# Patient Record
Sex: Female | Born: 1970 | Race: White | Hispanic: No | Marital: Married | Smoking: Never smoker
Health system: Southern US, Community
[De-identification: ages and names within clinical notes are randomized; demographics above are authoritative.]

## PROBLEM LIST (undated history)

## (undated) DIAGNOSIS — E559 Vitamin D deficiency, unspecified: Secondary | ICD-10-CM

## (undated) DIAGNOSIS — E7800 Pure hypercholesterolemia, unspecified: Secondary | ICD-10-CM

## (undated) DIAGNOSIS — E538 Deficiency of other specified B group vitamins: Secondary | ICD-10-CM

## (undated) DIAGNOSIS — R011 Cardiac murmur, unspecified: Secondary | ICD-10-CM

## (undated) DIAGNOSIS — Z8619 Personal history of other infectious and parasitic diseases: Secondary | ICD-10-CM

## (undated) HISTORY — DX: Personal history of other infectious and parasitic diseases: Z86.19

## (undated) HISTORY — DX: Deficiency of other specified B group vitamins: E53.8

## (undated) HISTORY — PX: TONSILLECTOMY: SUR1361

## (undated) HISTORY — DX: Cardiac murmur, unspecified: R01.1

## (undated) HISTORY — DX: Vitamin D deficiency, unspecified: E55.9

---

## 2005-04-18 ENCOUNTER — Ambulatory Visit: Payer: Self-pay | Admitting: Obstetrics & Gynecology

## 2006-12-16 ENCOUNTER — Emergency Department: Payer: Self-pay | Admitting: Otolaryngology

## 2009-08-01 ENCOUNTER — Ambulatory Visit: Payer: Self-pay | Admitting: Obstetrics and Gynecology

## 2010-08-15 ENCOUNTER — Ambulatory Visit: Payer: Self-pay | Admitting: Obstetrics and Gynecology

## 2011-09-11 ENCOUNTER — Ambulatory Visit: Payer: Self-pay | Admitting: Obstetrics and Gynecology

## 2012-09-16 ENCOUNTER — Ambulatory Visit: Payer: Self-pay | Admitting: Obstetrics and Gynecology

## 2016-01-16 ENCOUNTER — Encounter: Payer: Self-pay | Admitting: Family Medicine

## 2016-12-10 ENCOUNTER — Other Ambulatory Visit: Payer: Self-pay | Admitting: Obstetrics & Gynecology

## 2016-12-10 DIAGNOSIS — Z1231 Encounter for screening mammogram for malignant neoplasm of breast: Secondary | ICD-10-CM

## 2017-01-10 ENCOUNTER — Ambulatory Visit
Admission: RE | Admit: 2017-01-10 | Discharge: 2017-01-10 | Disposition: A | Discharge status: Home / Self Care | Payer: BC Managed Care – PPO | Source: Ambulatory Visit | Attending: Obstetrics & Gynecology | Admitting: Obstetrics & Gynecology

## 2017-01-10 ENCOUNTER — Encounter (HOSPITAL_COMMUNITY): Payer: Self-pay

## 2017-01-10 DIAGNOSIS — Z1231 Encounter for screening mammogram for malignant neoplasm of breast: Secondary | ICD-10-CM | POA: Diagnosis present

## 2017-12-10 ENCOUNTER — Other Ambulatory Visit: Payer: Self-pay | Admitting: Obstetrics & Gynecology

## 2017-12-10 DIAGNOSIS — Z1231 Encounter for screening mammogram for malignant neoplasm of breast: Secondary | ICD-10-CM

## 2018-01-20 ENCOUNTER — Ambulatory Visit
Admission: RE | Admit: 2018-01-20 | Discharge: 2018-01-20 | Disposition: A | Discharge status: Home / Self Care | Payer: BC Managed Care – PPO | Source: Ambulatory Visit | Attending: Obstetrics & Gynecology | Admitting: Obstetrics & Gynecology

## 2018-01-20 DIAGNOSIS — Z1231 Encounter for screening mammogram for malignant neoplasm of breast: Secondary | ICD-10-CM

## 2018-12-24 ENCOUNTER — Other Ambulatory Visit: Payer: Self-pay | Admitting: Obstetrics & Gynecology

## 2018-12-25 ENCOUNTER — Other Ambulatory Visit: Payer: Self-pay | Admitting: Obstetrics & Gynecology

## 2018-12-25 DIAGNOSIS — Z1231 Encounter for screening mammogram for malignant neoplasm of breast: Secondary | ICD-10-CM

## 2019-01-21 ENCOUNTER — Ambulatory Visit
Admission: RE | Admit: 2019-01-21 | Discharge: 2019-01-21 | Disposition: A | Discharge status: Home / Self Care | Payer: BC Managed Care – PPO | Source: Ambulatory Visit | Attending: Obstetrics & Gynecology | Admitting: Obstetrics & Gynecology

## 2019-01-21 DIAGNOSIS — Z1231 Encounter for screening mammogram for malignant neoplasm of breast: Secondary | ICD-10-CM | POA: Diagnosis present

## 2019-02-18 ENCOUNTER — Ambulatory Visit: Payer: BC Managed Care – PPO | Admitting: Internal Medicine

## 2019-02-18 VITALS — BP 110/68 | HR 89 | Temp 98.3°F | Ht 64.0 in | Wt 115.0 lb

## 2019-02-18 DIAGNOSIS — E538 Deficiency of other specified B group vitamins: Secondary | ICD-10-CM | POA: Diagnosis not present

## 2019-02-18 DIAGNOSIS — Z1389 Encounter for screening for other disorder: Secondary | ICD-10-CM | POA: Diagnosis not present

## 2019-02-18 DIAGNOSIS — I341 Nonrheumatic mitral (valve) prolapse: Secondary | ICD-10-CM

## 2019-02-18 DIAGNOSIS — Z0184 Encounter for antibody response examination: Secondary | ICD-10-CM

## 2019-02-18 DIAGNOSIS — Z780 Asymptomatic menopausal state: Secondary | ICD-10-CM

## 2019-02-18 DIAGNOSIS — S90415A Abrasion, left lesser toe(s), initial encounter: Secondary | ICD-10-CM | POA: Diagnosis not present

## 2019-02-18 DIAGNOSIS — Z1211 Encounter for screening for malignant neoplasm of colon: Secondary | ICD-10-CM | POA: Diagnosis not present

## 2019-02-18 DIAGNOSIS — E559 Vitamin D deficiency, unspecified: Secondary | ICD-10-CM

## 2019-02-18 DIAGNOSIS — Z1159 Encounter for screening for other viral diseases: Secondary | ICD-10-CM

## 2019-02-18 MED ORDER — CYANOCOBALAMIN 1000 MCG/ML IJ SOLN: 1000.0000 ug | INTRAMUSCULAR | 11 refills | Status: DC

## 2019-02-18 MED ORDER — MUPIROCIN 2 % EX OINT: 1.0000 "application " | TOPICAL_OINTMENT | Freq: Two times a day (BID) | CUTANEOUS | 0 refills | Status: DC

## 2019-02-18 NOTE — Progress Notes (Signed)
Pre visit review using our clinic review tool, if applicable. No additional management support is needed unless otherwise documented below in the visit note. 

## 2019-02-18 NOTE — Progress Notes (Signed)
Chief Complaint  Patient presents with  . Establish Care   New patient  1. B12 def 12/25/2018 189 on B12 injections needs refill  2. Vit D def 12/25/2018 25.3 on D3 50K IU weekly  3. Left great toe had pedicure and toenail is sore nothing tried other than removing nail polish to see what is going on and neosporin otc  4. Labs reviewed 12/25/2018 CMET, CBC normal, low vitamin B12 and vitamin D, TSH 2.214, TC 204,  TG 83, LDL 124, HDL 63.7  5. C/w menopause last cycle 05/2018 hot flashes at times last fsh 26 in 2018  6. H/o MVP dx'ed age 59 y.o no echo recently no symptoms I.e chest pain, dizziness, sob    Review of Systems  Constitutional: Negative for weight loss.  HENT: Negative for hearing loss.   Eyes: Negative for blurred vision.  Respiratory: Negative for shortness of breath.   Cardiovascular: Negative for chest pain.  Gastrointestinal: Negative for abdominal pain.  Genitourinary:       No menses since 05/2018    Musculoskeletal: Negative for falls.  Skin: Negative for rash.  Neurological: Negative for headaches.  Psychiatric/Behavioral: Negative for depression.   Past Medical History:  Diagnosis Date  . B12 deficiency   . Heart murmur    MVP dx'ed age 37 no echo since   . History of chicken pox   . Vitamin D deficiency    Past Surgical History:  Procedure Laterality Date  . CESAREAN SECTION    . TONSILLECTOMY     Family History  Problem Relation Age of Onset  . Arthritis Mother   . Diabetes Father        type 2  . Breast cancer Neg Hx    Social History   Socioeconomic History  . Marital status: Married    Spouse name: Not on file  . Number of children: Not on file  . Years of education: Not on file  . Highest education level: Not on file  Occupational History  . Not on file  Social Needs  . Financial resource strain: Not on file  . Food insecurity:    Worry: Not on file    Inability: Not on file  . Transportation needs:    Medical: Not on file   Non-medical: Not on file  Tobacco Use  . Smoking status: Never Smoker  . Smokeless tobacco: Never Used  Substance and Sexual Activity  . Alcohol use: Yes    Comment: men  . Drug use: Not Currently  . Sexual activity: Yes    Partners: Male  Lifestyle  . Physical activity:    Days per week: Not on file    Minutes per session: Not on file  . Stress: Not on file  Relationships  . Social connections:    Talks on phone: Not on file    Gets together: Not on file    Attends religious service: Not on file    Active member of club or organization: Not on file    Attends meetings of clubs or organizations: Not on file    Relationship status: Not on file  . Intimate partner violence:    Fear of current or ex partner: Not on file    Emotionally abused: Not on file    Physically abused: Not on file    Forced sexual activity: Not on file  Other Topics Concern  . 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 cyanocobalamin (,VITAMIN B-12,) 1000 MCG/ML injection Inject 1 mL (1,000 mcg total) into the muscle every 30 (thirty) days.  . Vitamin D, Ergocalciferol, (DRISDOL) 1.25 MG (50000 UT) CAPS capsule Take 50,000 Units by mouth every 7 (seven) days.   . [DISCONTINUED] cyanocobalamin (,VITAMIN B-12,) 1000 MCG/ML injection Inject into the muscle.   No Known Allergies Recent Results (from the past 2160 hour(s))  Urinalysis, Routine w reflex microscopic     Status: Abnormal   Collection Time: 02/18/19  4:04 PM  Result Value Ref Range   Color, Urine YELLOW YELLOW   APPearance CLEAR CLEAR   Specific Gravity, Urine 1.008 1.001 - 1.03   pH 8.0 5.0 - 8.0   Glucose, UA NEGATIVE NEGATIVE   Bilirubin Urine NEGATIVE NEGATIVE   Ketones, ur NEGATIVE NEGATIVE   Hgb urine dipstick NEGATIVE NEGATIVE   Protein, ur NEGATIVE NEGATIVE    Nitrite NEGATIVE NEGATIVE   Leukocytes,Ua 3+ (A) NEGATIVE   WBC, UA 40-60 (A) 0 - 5 /HPF   RBC / HPF NONE SEEN 0 - 2 /HPF   Squamous Epithelial / LPF 0-5 < OR = 5 /HPF   Bacteria, UA NONE SEEN NONE SEEN /HPF   Hyaline Cast NONE SEEN NONE SEEN /LPF  FSH     Status: Abnormal   Collection Time: 02/18/19  4:04 PM  Result Value Ref Range   FSH 125.0 (H) mIU/mL    Comment:                     Reference Range .              Follicular Phase       7.0-62.3              Mid-cycle Peak         3.1-17.7              Luteal Phase           1.5- 9.1              Postmenopausal       23.0-116.3              .   Hepatitis B surface antibody,quantitative     Status: Abnormal   Collection Time: 02/18/19  4:04 PM  Result Value Ref Range   Hepatitis B-Post <5 (L) > OR = 10 mIU/mL    Comment: . Patient does not have immunity to hepatitis B virus. . For additional information, please refer to http://education.quest[facility].com/faq/FAQ105 (This link is being provided for informational/ educational purposes only).   Hepatitis A Ab, Total     Status: Abnormal   Collection Time: 02/18/19  4:04 PM  Result Value Ref Range   Hepatitis A AB,Total REACTIVE (A) NON-REACTI    Comment: . For additional information, please refer to  http://education.[facility].com/faq/FAQ202  (This link is being provided for informational/ educational purposes only.) .   Measles/Mumps/Rubella Immunity     Status: Abnormal   Collection Time: 02/18/19  4:04 PM  Result Value Ref Range   Rubeola IgG 27.80 AU/mL    Comment: AU/mL            Interpretation -----            -------------- <13.50  Negative 13.50-16.49      Equivocal >16.49           Positive . A positive result indicates that the patient has antibody to measles virus. It does not differentiate  between an active or past infection. The clinical  diagnosis must be interpreted in conjunction with  clinical signs and symptoms of the  patient.    Mumps IgG <9.00 (L) AU/mL    Comment:  AU/mL           Interpretation -------         ---------------- <9.00             Negative 9.00-10.99        Equivocal >10.99            Positive A positive result indicates that the patient has  antibody to mumps virus. It does not differentiate between an  active or past infection. The clinical diagnosis must be interpreted in conjunction with clinical signs and symptoms of the patient. .    Rubella 8.99 index    Comment:     Index            Interpretation     -----            --------------       <0.90            Not consistent with Immunity     0.90-0.99        Equivocal     > or = 1.00      Consistent with Immunity  . The presence of rubella IgG antibody suggests  immunization or past or current infection with rubella virus.    Objective  Body mass index is 19.74 kg/m. Wt Readings from Last 3 Encounters:  02/18/19 115 lb (52.2 kg)   Temp Readings from Last 3 Encounters:  02/18/19 98.3 F (36.8 C) (Oral)   BP Readings from Last 3 Encounters:  02/18/19 110/68   Pulse Readings from Last 3 Encounters:  02/18/19 89    Physical Exam Vitals signs and nursing note reviewed.  Constitutional:      Appearance: Normal appearance. She is well-developed and well-groomed.  HENT:     Head: Normocephalic and atraumatic.     Nose: Nose normal.     Mouth/Throat:     Mouth: Mucous membranes are moist.     Pharynx: Oropharynx is clear.  Eyes:     Conjunctiva/sclera: Conjunctivae normal.     Pupils: Pupils are equal, round, and reactive to light.  Cardiovascular:     Rate and Rhythm: Normal rate and regular rhythm.     Heart sounds: Murmur present.  Pulmonary:     Effort: Pulmonary effort is normal.     Breath sounds: Normal breath sounds.  Musculoskeletal:       Feet:  Skin:    General: Skin is warm and dry.  Neurological:     General: No focal deficit present.     Mental Status: She is alert and oriented to  person, place, and time. Mental status is at baseline.     Gait: Gait normal.  Psychiatric:        Attention and Perception: Attention and perception normal.        Mood and Affect: Mood and affect normal.        Speech: Speech normal.        Behavior: Behavior normal. Behavior is cooperative.        Thought Content:  Thought content normal.        Cognition and Memory: Cognition and memory normal.        Judgment: Judgment normal.     Assessment   1. B12 def 189 12/25/2018  2. Vitamin D def  3. W/o menses with some hot flashes no cycle since 05/2018  4. MVP w/o sxs  5.left toenail cut back too close vs mild paronychia  6. HM Plan  1.  B12 injections total care monthly  2.  50K IU D3 then 5000 IU D3 otc qd  5.  Warm soaks and bactroban 2x per day  3.  Check FSH today  4.  Consider echo in future  6.  Per pt flu shot had 2019  Tdap 01/07/16  Had hep A/B vx check titer  Check MMR titer  Consider shingrix vaccine x 2 doses in future   LMP 05/2018 check [place] today likely menopause prior [place] 26 in 2018  Pap ob/gyn no FH breast cancer  -pap 12/10/16 neg pap neg HPV f/u Dr. Leonides Schanz 11/2019 pap  -cervical bx 12/26/2016 normal benign   Mammogram 01/21/2019 normal  Colonoscopy referred wants done 06/2019 referred today Dr. Tiffany Kocher  Dermatology Dr. Evorn Gong f/u summer 2020 h/o atypical nevi  Check FSH, UA, hep A/B/MMR today   Eye MD [facility]  Dentist Dr. Eugenie Birks  Provider: Dr. Olivia Mackie McLean-Scocuzza-Internal Medicine

## 2019-02-18 NOTE — Patient Instructions (Addendum)
Tumeric capsules  Tylenol  Glucosamine/chrondroitin   shingrix vaccine x 2 doses call for nurse visit  Call [facility] and notify want colonoscopy 06/2019   Paronychia Paronychia is an infection of the skin that surrounds a nail. It usually affects the skin around a fingernail, but it may also occur near a toenail. It often causes pain and swelling around the nail. In some cases, a collection of pus (abscess) can form near or under the nail.  This condition may develop suddenly, or it may develop gradually over a longer period. In most cases, paronychia is not serious, and it will clear up with treatment. What are the causes? This condition may be caused by bacteria or a fungus. These germs can enter the body through an opening in the skin, such as a cut or a hangnail. What increases the risk? This condition is more likely to develop in people who:  Get their hands wet often, such as those who work as Fish farm manager, bartenders, or nurses.  Bite their fingernails or suck their thumbs.  Trim their nails very short.  Have hangnails or injured fingertips.  Get manicures.  Have diabetes. What are the signs or symptoms? Symptoms of this condition include:  Redness and swelling of the skin near the nail.  Tenderness around the nail when you touch the area.  Pus-filled bumps under the skin at the base and sides of the nail (cuticle).  Fluid or pus under the nail.  Throbbing pain in the area. How is this diagnosed? This condition is diagnosed with a physical exam. In some cases, a sample of pus may be tested to determine what type of bacteria or fungus is causing the condition. How is this treated? Treatment depends on the cause and severity of your condition. If your condition is mild, it may clear up on its own in a few days or after soaking in warm water. If needed, treatment may include:  Antibiotic medicine, if your infection is caused by bacteria.  Antifungal medicine, if  your infection is caused by a fungus.  A procedure to drain pus from an abscess.  Anti-inflammatory medicine (corticosteroids). Follow these instructions at home: Wound care  Keep the affected area clean.  Soak the affected area in warm water, if told to do so by your health care provider. You may be told to do this for 20 minutes, 2-3 times a day.  Keep the area dry when you are not soaking it.  Do not try to drain an abscess yourself.  Follow instructions from your health care provider about how to take care of the affected area. Make sure you: ? Wash your hands with soap and water before you change your bandage (dressing). If soap and water are not available, use hand sanitizer. ? Change your dressing as told by your health care provider.  If you had an abscess drained, check the area every day for signs of infection. Check for: ? Redness, swelling, or pain. ? Fluid or blood. ? Warmth. ? Pus or a bad smell. Medicines   Take over-the-counter and prescription medicines only as told by your health care provider.  If you were prescribed an antibiotic medicine, take it as told by your health care provider. Do not stop taking the antibiotic even if you start to feel better. General instructions  Avoid contact with harsh chemicals.  Do not pick at the affected area. Prevention  To prevent this condition from happening again: ? Wear rubber gloves when washing dishes  or doing other tasks that require your hands to get wet. ? Wear gloves if your hands might come in contact with cleaners or other chemicals. ? Avoid injuring your nails or fingertips. ? Do not bite your nails or tear hangnails. ? Do not cut your nails very short. ? Do not cut your cuticles. ? Use clean nail clippers or scissors when trimming nails. Contact a health care provider if:  Your symptoms get worse or do not improve with treatment.  You have continued or increased fluid, blood, or pus coming from the  affected area.  Your finger or knuckle becomes swollen or difficult to move. Get help right away if you have:  A fever or chills.  Redness spreading away from the affected area.  Joint or muscle pain. Summary  Paronychia is an infection of the skin that surrounds a nail. It often causes pain and swelling around the nail. In some cases, a collection of pus (abscess) can form near or under the nail.  This condition may be caused by bacteria or a fungus. These germs can enter the body through an opening in the skin, such as a cut or a hangnail.  If your condition is mild, it may clear up on its own in a few days. If needed, treatment may include medicine or a procedure to drain pus from an abscess.  To prevent this condition from happening again, wear gloves if doing tasks that require your hands to get wet or to come in contact with chemicals. Also avoid injuring your nails or fingertips. This information is not intended to replace advice given to you by your health care provider. Make sure you discuss any questions you have with your health care provider. Document Released: 06/04/2001 Document Revised: 12/22/2017 Document Reviewed: 12/22/2017 Elsevier Interactive Patient Education  2019 [employer] Inc.  Recombinant Zoster (Shingles) Vaccine, RZV: What You Need to Know 1. Why get vaccinated? Shingles (also called herpes zoster, or just zoster) is a painful skin rash, often with blisters. Shingles is caused by the varicella zoster virus, the same virus that causes chickenpox. After you have chickenpox, the virus stays in your body and can cause shingles later in life. You can't catch shingles from another person. However, a person who has never had chickenpox (or chickenpox vaccine) could get chickenpox from someone with shingles. A shingles rash usually appears on one side of the face or body and heals within 2 to 4 weeks. Its main symptom is pain, which can be severe. Other symptoms can  include fever, headache, chills, and upset stomach. Very rarely, a shingles infection can lead to pneumonia, hearing problems, blindness, brain inflammation (encephalitis), or death. For about 1 person in 5, severe pain can continue even long after the rash has cleared up. This long-lasting pain is called post-herpetic neuralgia (PHN). Shingles is far more common in people 6 years of age and older than in younger people, and the risk increases with age. It is also more common in people whose immune system is weakened because of a disease such as cancer, or by drugs such as steroids or chemotherapy. At least 1 million people a year in the [place] get shingles. 2. Shingles vaccine (recombinant) Recombinant shingles vaccine was approved by FDA in 2017 for the prevention of shingles. In clinical trials, it was more than 90% effective in preventing shingles. It can also reduce the likelihood of PHN. Two doses, 2 to 6 months apart, are recommended for adults 50 and older. This  vaccine is also recommended for people who have already gotten the live shingles vaccine (Zostavax). There is no live virus in this vaccine. 3. Some people should not get this vaccine Tell your vaccine provider if you:  Have any severe, life-threatening allergies. A person who has ever had a life-threatening allergic reaction after a dose of recombinant shingles vaccine, or has a severe allergy to any component of this vaccine, may be advised not to be vaccinated. Ask your health care provider if you want information about vaccine components.  Are pregnant or breastfeeding. There is not much information about use of recombinant shingles vaccine in pregnant or nursing women. Your healthcare provider might recommend delaying vaccination.  Are not feeling well. If you have a mild illness, such as a cold, you can probably get the vaccine today. If you are moderately or severely ill, you should probably wait until you recover. Your  doctor can advise you. 4. Risks of a vaccine reaction With any medicine, including vaccines, there is a chance of reactions. After recombinant shingles vaccination, a person might experience:  Pain, redness, soreness, or swelling at the site of the injection  Headache, muscle aches, fever, shivering, fatigue In clinical trials, most people got a sore arm with mild or moderate pain after vaccination, and some also had redness and swelling where they got the shot. Some people felt tired, had muscle pain, a headache, shivering, fever, stomach pain, or nausea. About 1 out of 6 people who got recombinant zoster vaccine experienced side effects that prevented them from doing regular activities. Symptoms went away on their own in about 2 to 3 days. Side effects were more common in younger people. You should still get the second dose of recombinant zoster vaccine even if you had one of these reactions after the first dose. Other things that could happen after this vaccine:  People sometimes faint after medical procedures, including vaccination. Sitting or lying down for about 15 minutes can help prevent fainting and injuries caused by a fall. Tell your provider if you feel dizzy or have vision changes or ringing in the ears.  Some people get shoulder pain that can be more severe and longer-lasting than routine soreness that can follow injections. This happens very rarely.  Any medication can cause a severe allergic reaction. Such reactions to a vaccine are estimated at about 1 in a million doses, and would happen within a few minutes to a few hours after the vaccination. As with any medicine, there is a very remote chance of a vaccine causing a serious injury or death. The safety of vaccines is always being monitored. For more information, visit: http://floyd.org/ 5. What if there is a serious problem? What should I look for?  Look for anything that concerns you, such as signs of a severe  allergic reaction, very high fever, or unusual behavior. Signs of a severe allergic reaction can include hives, swelling of the face and throat, difficulty breathing, a fast heartbeat, dizziness, and weakness. These would usually start a few minutes to a few hours after the vaccination. What should I do?  If you think it is a severe allergic reaction or other emergency that can't wait, call 9-1-1 or get to the nearest hospital. Otherwise, call your health care provider. Afterward, the reaction should be reported to the Vaccine Adverse Event Reporting System (VAERS). Your doctor should file this report, or you can do it yourself through the VAERS website at www.vaers.LAgents.no, or by calling 1-(714) 187-0869. VAERS does not  give medical advice. 6. How can I learn more?  Ask your health care provider. He or she can give you the vaccine package insert or suggest other sources of information.  Call your local or state health department.  Contact the Centers for Disease Control and Prevention (CDC): ? Call 403-689-3566 (1-800-CDC-INFO) or ? Visit CDC's vaccines website at PicCapture.uy CDC Vaccine Information Statement Recombinant Zoster Vaccine (02/03/2017) This information is not intended to replace advice given to you by your health care provider. Make sure you discuss any questions you have with your health care provider. Document Released: 02/18/2017 Document Revised: 07/15/2018 Document Reviewed: 07/15/2018 Elsevier Interactive Patient Education  2019 [employer].  Arthritis Arthritis is a term that is commonly used to refer to joint pain or joint disease. There are more than 100 types of arthritis. What are the causes? The most common cause of this condition is wear and tear of a joint. Other causes include:  Gout.  Inflammation of a joint.  An infection of a joint.  Sprains and other injuries near the joint.  A drug reaction or allergic reaction. In some cases, the cause  may not be known. What are the signs or symptoms? The main symptom of this condition is pain in the joint with movement. Other symptoms include:  Redness, swelling, or stiffness at a joint.  Warmth coming from the joint.  Fever.  Overall feeling of illness. How is this diagnosed? This condition may be diagnosed with a physical exam and tests, including:  Blood tests.  Urine tests.  Imaging tests, such as MRI, X-rays, or a CT scan. Sometimes, fluid is removed from a joint for testing. How is this treated? Treatment for this condition may involve:  Treatment of the cause, if it is known.  Rest.  Raising (elevating) the joint.  Applying cold or hot packs to the joint.  Medicines to improve symptoms and reduce inflammation.  Injections of a steroid such as cortisone into the joint to help reduce pain and inflammation. Depending on the cause of your arthritis, you may need to make lifestyle changes to reduce stress on your joint. These changes may include exercising more and losing weight. Follow these instructions at home: Medicines  Take over-the-counter and prescription medicines only as told by your health care provider.  Do not take aspirin to relieve pain if gout is suspected. Activity  Rest your joint if told by your health care provider. Rest is important when your disease is active and your joint feels painful, swollen, or stiff.  Avoid activities that make the pain worse. It is important to balance activity with rest.  Exercise your joint regularly with range-of-motion exercises as told by your health care provider. Try doing low-impact exercise, such as: ? Swimming. ? Water aerobics. ? Biking. ? Walking. Joint Care   If your joint is swollen, keep it elevated if told by your health care provider.  If your joint feels stiff in the morning, try taking a warm shower.  If directed, apply heat to the joint. If you have diabetes, do not apply heat without  permission from your health care provider. ? Put a towel between the joint and the hot pack or heating pad. ? Leave the heat on the area for 20-30 minutes.  If directed, apply ice to the joint: ? Put ice in a plastic bag. ? Place a towel between your skin and the bag. ? Leave the ice on for 20 minutes, 2-3 times per day.  Keep  all follow-up visits as told by your health care provider. This is important. Contact a health care provider if:  The pain gets worse.  You have a fever. Get help right away if:  You develop severe joint pain, swelling, or redness.  Many joints become painful and swollen.  You develop severe back pain.  You develop severe weakness in your leg.  You cannot control your bladder or bowels. This information is not intended to replace advice given to you by your health care provider. Make sure you discuss any questions you have with your health care provider. Document Released: 01/16/2005 Document Revised: 05/16/2016 Document Reviewed: 03/06/2015 Elsevier Interactive Patient Education  2019 [employer].

## 2019-02-19 LAB — URINALYSIS, ROUTINE W REFLEX MICROSCOPIC
BACTERIA UA: NONE SEEN /HPF
Bilirubin Urine: NEGATIVE
GLUCOSE, UA: NEGATIVE
HYALINE CAST: NONE SEEN /LPF
Hgb urine dipstick: NEGATIVE
KETONES UR: NEGATIVE
Nitrite: NEGATIVE
PROTEIN: NEGATIVE
RBC / HPF: NONE SEEN /HPF (ref 0–2)
Specific Gravity, Urine: 1.008 (ref 1.001–1.03)
pH: 8 (ref 5.0–8.0)

## 2019-02-19 LAB — MEASLES/MUMPS/RUBELLA IMMUNITY
Mumps IgG: <9 AU/mL — ABNORMAL LOW
RUBEOLA IGG: 27.8 [AU]/ml
Rubella: 8.99 index

## 2019-02-19 LAB — FOLLICLE STIMULATING HORMONE: FSH: 125 m[IU]/mL — AB

## 2019-02-19 LAB — HEPATITIS A ANTIBODY, TOTAL: Hepatitis A AB,Total: REACTIVE — AB

## 2019-02-19 LAB — HEPATITIS B SURFACE ANTIBODY, QUANTITATIVE: Hepatitis B-Post: <5 m[IU]/mL — ABNORMAL LOW (ref >=10)

## 2019-02-21 ENCOUNTER — Encounter: Payer: Self-pay | Admitting: Internal Medicine

## 2019-02-21 DIAGNOSIS — E559 Vitamin D deficiency, unspecified: Secondary | ICD-10-CM | POA: Insufficient documentation

## 2019-02-21 DIAGNOSIS — E538 Deficiency of other specified B group vitamins: Secondary | ICD-10-CM | POA: Insufficient documentation

## 2019-02-21 DIAGNOSIS — I341 Nonrheumatic mitral (valve) prolapse: Secondary | ICD-10-CM | POA: Insufficient documentation

## 2019-02-21 DIAGNOSIS — Z780 Asymptomatic menopausal state: Secondary | ICD-10-CM | POA: Insufficient documentation

## 2019-02-24 ENCOUNTER — Telehealth: Payer: Self-pay | Admitting: Internal Medicine

## 2019-02-24 NOTE — Telephone Encounter (Signed)
Copied from CRM [id number]. Topic: Quick Communication - Lab Results (Clinic Use ONLY) >> Feb 22, 2019 11:26 AM Bronwen Betters, CMA wrote: Called patient to inform them of 2MAR20 lab results. When patient returns call, triage nurse may disclose results.  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

## 2019-02-24 NOTE — Telephone Encounter (Signed)
Patient called, left VM to return call to the office for lab results. 

## 2019-06-22 ENCOUNTER — Encounter: Payer: Self-pay | Admitting: Internal Medicine

## 2019-06-22 ENCOUNTER — Other Ambulatory Visit: Payer: Self-pay

## 2019-06-22 ENCOUNTER — Ambulatory Visit (INDEPENDENT_AMBULATORY_CARE_PROVIDER_SITE_OTHER): Payer: BC Managed Care – PPO | Admitting: Internal Medicine

## 2019-06-22 ENCOUNTER — Other Ambulatory Visit: Payer: BC Managed Care – PPO

## 2019-06-22 DIAGNOSIS — L509 Urticaria, unspecified: Secondary | ICD-10-CM | POA: Diagnosis not present

## 2019-06-22 DIAGNOSIS — N3000 Acute cystitis without hematuria: Secondary | ICD-10-CM

## 2019-06-22 DIAGNOSIS — L237 Allergic contact dermatitis due to plants, except food: Secondary | ICD-10-CM | POA: Diagnosis not present

## 2019-06-22 DIAGNOSIS — B379 Candidiasis, unspecified: Secondary | ICD-10-CM

## 2019-06-22 MED ORDER — HYDROXYZINE HCL 25 MG PO TABS: 12.5000 mg | ORAL_TABLET | Freq: Three times a day (TID) | ORAL | 0 refills | Status: DC | PRN

## 2019-06-22 MED ORDER — TRIAMCINOLONE ACETONIDE 0.1 % EX CREA: 1.0000 "application " | TOPICAL_CREAM | Freq: Two times a day (BID) | CUTANEOUS | 0 refills | Status: DC | PRN

## 2019-06-22 MED ORDER — FLUCONAZOLE 150 MG PO TABS: 150.0000 mg | ORAL_TABLET | Freq: Once | ORAL | 0 refills | Status: AC

## 2019-06-22 MED ORDER — HYDROCORTISONE ACETATE 2.5 % EX CREA: 1.0000 "application " | TOPICAL_CREAM | Freq: Two times a day (BID) | CUTANEOUS | 0 refills | Status: DC | PRN

## 2019-06-22 NOTE — Progress Notes (Addendum)
Telephone Note failed Doxy  I connected with Rockey Situ  on 06/22/19 at  2:30 PM EDT by telephone and verified that I am speaking with the correct person using two identifiers.  Location patient: home Location provider:work  Persons participating in the virtual visit: patient, provider  I discussed the limitations of evaluation and management by telemedicine and the availability of in person appointments. The patient expressed understanding and agreed to proceed.   HPI: 1. She was hiking Sat. And developed rash to r>l cheek, chest and arms which is itching worse in am and night and reports poison ivy in 04/2019 and she is c/w it being this  2. C/o urinary pressure and hesistancy h/o UTI 2x in life and feels like UTI   ROS: See pertinent positives and negatives per HPI.  Past Medical History:  Diagnosis Date  . B12 deficiency   . 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 History of chicken pox   . Vitamin D deficiency     Past Surgical History:  Procedure Laterality Date  . CESAREAN SECTION    . TONSILLECTOMY      Family History  Problem Relation Age of Onset  . Arthritis Mother   . Diabetes Father        type 2  . Breast cancer Neg Hx     SOCIAL HX:  Masters  Works Product manager [employer]  Married  2 kids  No guns  Wears Museum/gallery curator in relationship  Likes mission trips   Current Outpatient Medications:  .  cyanocobalamin (,VITAMIN B-12,) 1000 MCG/ML injection, Inject 1 mL (1,000 mcg total) into the muscle every 30 (thirty) days., Disp: 4 mL, Rfl: 11 .  fluconazole (DIFLUCAN) 150 MG tablet, Take 1 tablet (150 mg total) by mouth once for 1 dose., Disp: 1 tablet, Rfl: 0 .  Hydrocortisone Acetate 2.5 % CREA, Apply 1 application topically 2 (two) times daily as needed. To face, Disp: 30 g, Rfl: 0 .  hydrOXYzine (ATARAX/VISTARIL) 25 MG tablet, Take 0.5-1 tablets (12.5-25 mg total) by mouth every 8 (eight) hours as needed., Disp: 90 tablet,  Rfl: 0 .  mupirocin ointment (BACTROBAN) 2 %, Apply 1 application topically 2 (two) times daily. X 1-2 weeks, Disp: 30 g, Rfl: 0 .  triamcinolone cream (KENALOG) 0.1 %, Apply 1 application topically 2 (two) times daily as needed., Disp: 454 g, Rfl: 0 .  Vitamin D, Ergocalciferol, (DRISDOL) 1.25 MG (50000 UT) CAPS capsule, Take 50,000 Units by mouth every 7 (seven) days. , Disp: , Rfl:   EXAM:  VITALS per patient if applicable:  GENERAL: alert, oriented, appears well and in no acute distress  HEENT: atraumatic, conjunttiva clear, no obvious abnormalities on inspection of external nose and ears  NECK: normal movements of the head and neck  LUNGS: on inspection no signs of respiratory distress, breathing rate appears normal, no obvious gross SOB, gasping or wheezing  CV: no obvious cyanosis  MS: moves all visible extremities without noticeable abnormality  PSYCH/NEURO: pleasant and cooperative, no obvious depression or anxiety, speech and thought processing grossly intact  ASSESSMENT AND PLAN:  Discussed the following assessment and plan:  Acute cystitis without hematuria - Plan: Urinalysis, Routine w reflex microscopic, Urine Culture Bactrim bid x 7 days has left over pills from 12/2018 another provider  Diflucan prn   Hives vs poison ivy - Plan: triamcinolone cream (KENALOG) 0.1 % body, Hydrocortisone Acetate 2.5 % CREAM face,  hydrOXYzine (ATARAX/VISTARIL) 25 MG tablet tid prn   Poison ivy dermatitis - Plan: triamcinolone cream (KENALOG) 0.1 %, Hydrocortisone Acetate 2.5 % CREA, hydrOXYzine (ATARAX/VISTARIL) 25 MG tablet  -saw dermatology [place] dermatitis Dr. Evorn Gong 06/24/2019 f/u in 1 month   Yeast infection - Plan: fluconazole (DIFLUCAN) 150 MG tablet  HM Per pt flu shot had 2019  Tdap 01/07/16  Had hep A/B vx check titer  rec MMR and hep B vaccines Consider shingrix vaccine x 2 doses in future   LMP 05/2018 likely menopause [address]   Pap ob/gyn no FH breast cancer  -pap  12/10/16 neg pap neg HPV f/u Dr. Leonides Schanz 11/2019 pap  -cervical bx 12/26/2016 normal benign   Mammogram 01/21/2019 normal  Colonoscopy referred wants done 06/2019 referred today Dr. Tiffany Kocher tele visit 05/24/2019 Dermatology Dr. Evorn Gong f/u summer 2020 h/o atypical nevi  [address] menopause   Eye MD [place] [facility]  Dentist Dr. Eugenie Birks  Derm appt 07/13/19   I discussed the assessment and treatment plan with the patient. The patient was provided an opportunity to ask questions and all were answered. The patient agreed with the plan and demonstrated an understanding of the instructions.   The patient was advised to call back or seek an in-person evaluation if the symptoms worsen or if the condition fails to improve as anticipated.  Time spent 20 minutes  Delorise Jackson, MD

## 2019-06-24 LAB — URINALYSIS, ROUTINE W REFLEX MICROSCOPIC
Bacteria, UA: NONE SEEN /HPF
Bilirubin Urine: NEGATIVE
Glucose, UA: NEGATIVE
Hgb urine dipstick: NEGATIVE
Hyaline Cast: NONE SEEN /LPF
Ketones, ur: NEGATIVE
Nitrite: NEGATIVE
Protein, ur: NEGATIVE
RBC / HPF: NONE SEEN /HPF (ref 0–2)
Specific Gravity, Urine: 1.005 (ref 1.001–1.03)
pH: 6 (ref 5.0–8.0)

## 2019-06-24 LAB — URINE CULTURE
MICRO NUMBER:: 621517
SPECIMEN QUALITY:: ADEQUATE

## 2019-10-12 LAB — HM COLONOSCOPY

## 2020-02-17 ENCOUNTER — Telehealth: Payer: Self-pay | Admitting: Internal Medicine

## 2020-02-17 NOTE — Telephone Encounter (Signed)
Okay for labs

## 2020-02-17 NOTE — Telephone Encounter (Signed)
Pt wanted to have labs done before her CPE on 3/12

## 2020-02-20 ENCOUNTER — Other Ambulatory Visit: Payer: Self-pay | Admitting: Internal Medicine

## 2020-02-20 DIAGNOSIS — Z0000 Encounter for general adult medical examination without abnormal findings: Secondary | ICD-10-CM

## 2020-02-20 DIAGNOSIS — Z1389 Encounter for screening for other disorder: Secondary | ICD-10-CM

## 2020-02-20 DIAGNOSIS — I341 Nonrheumatic mitral (valve) prolapse: Secondary | ICD-10-CM

## 2020-02-20 DIAGNOSIS — E538 Deficiency of other specified B group vitamins: Secondary | ICD-10-CM

## 2020-02-20 DIAGNOSIS — Z13220 Encounter for screening for lipoid disorders: Secondary | ICD-10-CM

## 2020-02-20 DIAGNOSIS — Z1329 Encounter for screening for other suspected endocrine disorder: Secondary | ICD-10-CM

## 2020-02-20 DIAGNOSIS — E559 Vitamin D deficiency, unspecified: Secondary | ICD-10-CM

## 2020-02-20 NOTE — Telephone Encounter (Signed)
Sch fasting labs asap at least 2-3 days prior to appt so labs ready by 03/03/20   Thanks tMS

## 2020-02-21 NOTE — Telephone Encounter (Signed)
Left message to return call 

## 2020-02-22 ENCOUNTER — Ambulatory Visit: Payer: BC Managed Care – PPO | Admitting: Internal Medicine

## 2020-02-22 NOTE — Telephone Encounter (Signed)
Patient was scheduled for 3/03

## 2020-02-23 ENCOUNTER — Other Ambulatory Visit: Payer: Self-pay

## 2020-02-23 ENCOUNTER — Other Ambulatory Visit (INDEPENDENT_AMBULATORY_CARE_PROVIDER_SITE_OTHER): Payer: BC Managed Care – PPO

## 2020-02-23 DIAGNOSIS — E559 Vitamin D deficiency, unspecified: Secondary | ICD-10-CM | POA: Diagnosis not present

## 2020-02-23 DIAGNOSIS — Z0000 Encounter for general adult medical examination without abnormal findings: Secondary | ICD-10-CM | POA: Diagnosis not present

## 2020-02-23 DIAGNOSIS — Z1329 Encounter for screening for other suspected endocrine disorder: Secondary | ICD-10-CM

## 2020-02-23 DIAGNOSIS — E538 Deficiency of other specified B group vitamins: Secondary | ICD-10-CM | POA: Diagnosis not present

## 2020-02-23 DIAGNOSIS — Z13220 Encounter for screening for lipoid disorders: Secondary | ICD-10-CM | POA: Diagnosis not present

## 2020-02-23 DIAGNOSIS — Z1389 Encounter for screening for other disorder: Secondary | ICD-10-CM

## 2020-02-23 LAB — COMPREHENSIVE METABOLIC PANEL
ALT: 11 U/L (ref 0–35)
AST: 18 U/L (ref 0–37)
Albumin: 4.3 g/dL (ref 3.5–5.2)
Alkaline Phosphatase: 56 U/L (ref 39–117)
BUN: 14 mg/dL (ref 6–23)
CO2: 28 mEq/L (ref 19–32)
Calcium: 9.7 mg/dL (ref 8.4–10.5)
Chloride: 104 mEq/L (ref 96–112)
Creatinine, Ser: 0.84 mg/dL (ref 0.40–1.20)
GFR: 71.5 mL/min (ref >60.00)
Glucose, Bld: 77 mg/dL (ref 70–99)
Potassium: 3.9 mEq/L (ref 3.5–5.1)
Sodium: 138 mEq/L (ref 135–145)
Total Bilirubin: 0.7 mg/dL (ref 0.2–1.2)
Total Protein: 6.8 g/dL (ref 6.0–8.3)

## 2020-02-23 LAB — TSH: TSH: 1.4 u[IU]/mL (ref 0.35–4.50)

## 2020-02-23 LAB — CBC WITH DIFFERENTIAL/PLATELET
Basophils Absolute: 0 10*3/uL (ref 0.0–0.1)
Basophils Relative: 0.6 % (ref 0.0–3.0)
Eosinophils Absolute: 0.1 10*3/uL (ref 0.0–0.7)
Eosinophils Relative: 1.3 % (ref 0.0–5.0)
HCT: 40 % (ref 36.0–46.0)
Hemoglobin: 13.4 g/dL (ref 12.0–15.0)
Lymphocytes Relative: 33 % (ref 12.0–46.0)
Lymphs Abs: 1.4 10*3/uL (ref 0.7–4.0)
MCHC: 33.6 g/dL (ref 30.0–36.0)
MCV: 84.8 fl (ref 78.0–100.0)
Monocytes Absolute: 0.3 10*3/uL (ref 0.1–1.0)
Monocytes Relative: 8.2 % (ref 3.0–12.0)
Neutro Abs: 2.4 10*3/uL (ref 1.4–7.7)
Neutrophils Relative %: 56.9 % (ref 43.0–77.0)
Platelets: 208 10*3/uL (ref 150.0–400.0)
RBC: 4.71 Mil/uL (ref 3.87–5.11)
RDW: 12.6 % (ref 11.5–15.5)
WBC: 4.3 10*3/uL (ref 4.0–10.5)

## 2020-02-23 LAB — LIPID PANEL
Cholesterol: 191 mg/dL (ref 0–200)
HDL: 70.6 mg/dL (ref >39.00)
LDL Cholesterol: 110 mg/dL — ABNORMAL HIGH (ref 0–99)
NonHDL: 120.16
Total CHOL/HDL Ratio: 3
Triglycerides: 51 mg/dL (ref 0.0–149.0)
VLDL: 10.2 mg/dL (ref 0.0–40.0)

## 2020-02-23 LAB — VITAMIN B12: Vitamin B-12: 301 pg/mL (ref 211–911)

## 2020-02-23 LAB — VITAMIN D 25 HYDROXY (VIT D DEFICIENCY, FRACTURES): VITD: 46.22 ng/mL (ref 30.00–100.00)

## 2020-02-24 LAB — URINALYSIS, ROUTINE W REFLEX MICROSCOPIC
Bilirubin Urine: NEGATIVE
Glucose, UA: NEGATIVE
Hgb urine dipstick: NEGATIVE
Ketones, ur: NEGATIVE
Leukocytes,Ua: NEGATIVE
Nitrite: NEGATIVE
Protein, ur: NEGATIVE
Specific Gravity, Urine: 1.004 (ref 1.001–1.03)
pH: 6.5 (ref 5.0–8.0)

## 2020-03-01 ENCOUNTER — Other Ambulatory Visit: Payer: Self-pay

## 2020-03-03 ENCOUNTER — Telehealth: Payer: Self-pay | Admitting: Internal Medicine

## 2020-03-03 ENCOUNTER — Encounter: Payer: Self-pay | Admitting: Internal Medicine

## 2020-03-03 ENCOUNTER — Ambulatory Visit (INDEPENDENT_AMBULATORY_CARE_PROVIDER_SITE_OTHER): Payer: BC Managed Care – PPO | Admitting: Internal Medicine

## 2020-03-03 ENCOUNTER — Other Ambulatory Visit: Payer: Self-pay

## 2020-03-03 VITALS — BP 100/70 | HR 64 | Temp 97.7°F | Ht 64.0 in | Wt 115.8 lb

## 2020-03-03 DIAGNOSIS — Z1231 Encounter for screening mammogram for malignant neoplasm of breast: Secondary | ICD-10-CM | POA: Diagnosis not present

## 2020-03-03 DIAGNOSIS — E559 Vitamin D deficiency, unspecified: Secondary | ICD-10-CM

## 2020-03-03 DIAGNOSIS — E538 Deficiency of other specified B group vitamins: Secondary | ICD-10-CM

## 2020-03-03 DIAGNOSIS — Z0000 Encounter for general adult medical examination without abnormal findings: Secondary | ICD-10-CM | POA: Insufficient documentation

## 2020-03-03 MED ORDER — CYANOCOBALAMIN 1000 MCG/ML IJ SOLN
1000.0000 ug | INTRAMUSCULAR | 11 refills | Status: DC
Start: 2020-03-03 — End: 2021-03-07

## 2020-03-03 MED ORDER — VITAMIN D (ERGOCALCIFEROL) 1.25 MG (50000 UNIT) PO CAPS: 50000.0000 [IU] | ORAL_CAPSULE | ORAL | 3 refills | Status: DC

## 2020-03-03 NOTE — Patient Instructions (Addendum)
Consider shingrix 1st  Hep B vaccine new x 2 doses  MMR vaccine  Hepatitis B Vaccine, Recombinant injection What is this medicine? HEPATITIS B VACCINE (hep uh TAHY tis B VAK seen) is a vaccine. It is used to prevent an infection with the hepatitis B virus. This medicine may be used for other purposes; ask your health care provider or pharmacist if you have questions. COMMON BRAND NAME(S): Engerix-B, Recombivax HB What should I tell my health care provider before I take this medicine? They need to know if you have any of these conditions:  fever, infection  heart disease  hepatitis B infection  immune system problems  kidney disease  an unusual or allergic reaction to vaccines, yeast, other medicines, foods, dyes, or preservatives  pregnant or trying to get pregnant  breast-feeding How should I use this medicine? This vaccine is for injection into a muscle. It is given by a health care professional. A copy of Vaccine Information Statements will be given before each vaccination. Read this sheet carefully each time. The sheet may change frequently. Talk to your pediatrician regarding the use of this medicine in children. While this drug may be prescribed for children as young as newborn for selected conditions, precautions do apply. Overdosage: If you think you have taken too much of this medicine contact a poison control center or emergency room at once. NOTE: This medicine is only for you. Do not share this medicine with others. What if I miss a dose? It is important not to miss your dose. Call your doctor or health care professional if you are unable to keep an appointment. What may interact with this medicine?  medicines that suppress your immune function like adalimumab, anakinra, infliximab  medicines to treat cancer  steroid medicines like prednisone or cortisone This list may not describe all possible interactions. Give your health care provider a list of all the  medicines, herbs, non-prescription drugs, or dietary supplements you use. Also tell them if you smoke, drink alcohol, or use illegal drugs. Some items may interact with your medicine. What should I watch for while using this medicine? See your health care provider for all shots of this vaccine as directed. You must have 3 shots of this vaccine for protection from hepatitis B infection. Tell your doctor right away if you have any serious or unusual side effects after getting this vaccine. What side effects may I notice from receiving this medicine? Side effects that you should report to your doctor or health care professional as soon as possible:  allergic reactions like skin rash, itching or hives, swelling of the face, lips, or tongue  breathing problems  confused, irritated  fast, irregular heartbeat  flu-like syndrome  numb, tingling pain  seizures  unusually weak or tired Side effects that usually do not require medical attention (report to your doctor or health care professional if they continue or are bothersome):  diarrhea  fever  headache  loss of appetite  muscle pain  nausea  pain, redness, swelling, or irritation at site where injected  tiredness This list may not describe all possible side effects. Call your doctor for medical advice about side effects. You may report side effects to FDA at 1-800-FDA-1088. Where should I keep my medicine? This drug is given in a hospital or clinic and will not be stored at home. NOTE: This sheet is a summary. It may not cover all possible information. If you have questions about this medicine, talk to your doctor, pharmacist,  or health care provider.  2020 Elsevier/Gold Standard (2014-04-11 13:26:01)  Measles/Mumps/Rubella Vaccines, MMR injection What is this medicine? MEASLES VIRUS; MUMPS VIRUS; RUBELLA VIRUS VACCINE LIVE (MEE zuhlz VAHY ruhs; muhmps VAHY ruhs; roo bel uh VAHY ruhs vak SEEN [place] ) is used to prevent an  infection with measles (rubeola), mumps, and rubella ([place] measles) viruses. It is used to prevent infection in children over 54 months old, adults that have not been vaccinated and are not pregnant, and anyone traveling to countries where there are high rates of measles, mumps, or rubella. This medicine may be used for other purposes; ask your health care provider or pharmacist if you have questions. COMMON BRAND NAME(S): M-M-R II What should I tell my health care provider before I take this medicine? They need to know if you have any of these conditions:  bleeding disorder  cancer including leukemia or lymphoma  immune system problems  infection with fever  low levels of platelets in the blood  recent blood transfusion or immune globulin infusion  seizure disorder  taking medicines for immunosuppression  an unusual or allergic reaction to vaccines, eggs, neomycin, gelatin, other medicines, foods, dyes, or preservatives  pregnant or trying to get pregnant  breast-feeding How should I use this medicine? This vaccine is for injection under the skin. It is given by a health care professional. A copy of Vaccine Information Statements will be given before each vaccination. Read this sheet carefully each time. The sheet may change frequently. Talk to your pediatrician regarding the use of this medicine in children. While this drug may be prescribed for children as young as 37 months of age for selected conditions, precautions do apply. Overdosage: If you think you have taken too much of this medicine contact a poison control center or emergency room at once. NOTE: This medicine is only for you. Do not share this medicine with others. What if I miss a dose? Keep appointments for follow-up (booster) doses as directed. It is important not to miss your dose. Call your doctor or health care professional if you are unable to keep an appointment. What may interact with this medicine? Do not  take this medicine with any of the following medications:  adalimumab  anakinra  etanercept  infliximab  medicines that suppress your immune system  medicines to treat cancer This medicine may also interact with the following medications:  immune globulins  live virus vaccines This list may not describe all possible interactions. Give your health care provider a list of all the medicines, herbs, non-prescription drugs, or dietary supplements you use. Also tell them if you smoke, drink alcohol, or use illegal drugs. Some items may interact with your medicine. What should I watch for while using this medicine? Visit your doctor for check-ups as directed. Do not become pregnant for 3 months after receiving this vaccine. Women should inform their doctor if they wish to become pregnant or think they might be pregnant. There is a potential for serious side effects to an unborn child. Talk to your health care professional or pharmacist for more information. What side effects may I notice from receiving this medicine? Side effects that you should report to your doctor or health care professional as soon as possible:  allergic reactions like skin rash, itching or hives, swelling of the face, lips, or tongue  breathing problems  changes in hearing  changes in vision  difficulty walking  extreme changes in behavior  fast, irregular heartbeat  fever over 100 degrees  F  pain, tingling, numbness in the hands or feet  seizures  unusual bleeding or bruising  unusually weak or tired Side effects that usually do not require medical attention (report to your doctor or health care professional if they continue or are bothersome):  aches or pains  bruising, pain, swelling at site where injected  diarrhea  headache  low-grade fever of 100 degrees F or less  nausea, vomiting  runny nose, cough  sleepy  swollen glands This list may not describe all possible side effects.  Call your doctor for medical advice about side effects. You may report side effects to FDA at 1-800-FDA-1088. Where should I keep my medicine? This drug is given in a hospital or clinic and will not be stored at home. NOTE: This sheet is a summary. It may not cover all possible information. If you have questions about this medicine, talk to your doctor, pharmacist, or health care provider.  2020 Elsevier/Gold Standard (2014-01-07 11:04:43)  Recombinant Zoster (Shingles) Vaccine: What You Need to Know 1. Why get vaccinated? Recombinant zoster (shingles) vaccine can prevent shingles. Shingles (also called herpes zoster, or just zoster) is a painful skin rash, usually with blisters. In addition to the rash, shingles can cause fever, headache, chills, or upset stomach. More rarely, shingles can lead to pneumonia, hearing problems, blindness, brain inflammation (encephalitis), or death. The most common complication of shingles is long-term nerve pain called postherpetic neuralgia (PHN). PHN occurs in the areas where the shingles rash was, even after the rash clears up. It can last for months or years after the rash goes away. The pain from PHN can be severe and debilitating. About 10 to 18% of people who get shingles will experience PHN. The risk of PHN increases with age. An older adult with shingles is more likely to develop PHN and have longer lasting and more severe pain than a younger person with shingles. Shingles is caused by the varicella zoster virus, the same virus that causes chickenpox. After you have chickenpox, the virus stays in your body and can cause shingles later in life. Shingles cannot be passed from one person to another, but the virus that causes shingles can spread and cause chickenpox in someone who had never had chickenpox or received chickenpox vaccine. 2. Recombinant shingles vaccine Recombinant shingles vaccine provides strong protection against shingles. By preventing  shingles, recombinant shingles vaccine also protects against PHN. Recombinant shingles vaccine is the preferred vaccine for the prevention of shingles. However, a different vaccine, live shingles vaccine, may be used in some circumstances. The recombinant shingles vaccine is recommended for adults 50 years and older without serious immune problems. It is given as a two-dose series. This vaccine is also recommended for people who have already gotten another type of shingles vaccine, the live shingles vaccine. There is no live virus in this vaccine. Shingles vaccine may be given at the same time as other vaccines. 3. Talk with your health care provider Tell your vaccine provider if the person getting the vaccine:  Has had an allergic reaction after a previous dose of recombinant shingles vaccine, or has any severe, life-threatening allergies.  Is pregnant or breastfeeding.  Is currently experiencing an episode of shingles. In some cases, your health care provider may decide to postpone shingles vaccination to a future visit. People with minor illnesses, such as a cold, may be vaccinated. People who are moderately or severely ill should usually wait until they recover before getting recombinant shingles vaccine. Your health care  provider can give you more information. 4. Risks of a vaccine reaction  A sore arm with mild or moderate pain is very common after recombinant shingles vaccine, affecting about 80% of vaccinated people. Redness and swelling can also happen at the site of the injection.  Tiredness, muscle pain, headache, shivering, fever, stomach pain, and nausea happen after vaccination in more than half of people who receive recombinant shingles vaccine. In clinical trials, about 1 out of 6 people who got recombinant zoster vaccine experienced side effects that prevented them from doing regular activities. Symptoms usually went away on their own in 2 to 3 days. You should still get the  second dose of recombinant zoster vaccine even if you had one of these reactions after the first dose. People sometimes faint after medical procedures, including vaccination. Tell your provider if you feel dizzy or have vision changes or ringing in the ears. As with any medicine, there is a very remote chance of a vaccine causing a severe allergic reaction, other serious injury, or death. 5. What if there is a serious problem? An allergic reaction could occur after the vaccinated person leaves the clinic. If you see signs of a severe allergic reaction (hives, swelling of the face and throat, difficulty breathing, a fast heartbeat, dizziness, or weakness), call 9-1-1 and get the person to the nearest hospital. For other signs that concern you, call your health care provider. Adverse reactions should be reported to the Vaccine Adverse Event Reporting System (VAERS). Your health care provider will usually file this report, or you can do it yourself. Visit the VAERS website at www.vaers.SamedayNews.es or call (218)014-7525. VAERS is only for reporting reactions, and VAERS staff do not give medical advice. 6. How can I learn more?  Ask your health care provider.  Call your local or state health department.  Contact the Centers for Disease Control and Prevention (CDC): ? Call 956-746-9200 (1-800-CDC-INFO) or ? Visit CDC's website at http://hunter.com/ Vaccine Information Statement Recombinant Zoster Vaccine (10/21/2018) This information is not intended to replace advice given to you by your health care provider. Make sure you discuss any questions you have with your health care provider. Document Revised: 03/30/2019 Document Reviewed: 07/15/2018 Elsevier Patient Education  [place].

## 2020-03-03 NOTE — Telephone Encounter (Signed)
Left detailed message on secure line  

## 2020-03-03 NOTE — Telephone Encounter (Signed)
Yes 1x /month D3 50K IU

## 2020-03-03 NOTE — Telephone Encounter (Signed)
Pharmacy wanting to verify that Vitamin D is being switched from once a week to monthly.

## 2020-03-03 NOTE — Progress Notes (Signed)
Chief Complaint  Patient presents with  . Follow-up   Annual  Doing well reviewed labs 02/2020 b12 def and Vit D low normal  Results for AMALI, UHLS (MRN [id number]) as of 03/03/2020 18:06  02/23/2020 09:33 VITD: 46.22 Vitamin B12: 301  She is currently on diflucan for yeast infection     Review of Systems  Constitutional: Negative for weight loss.  HENT: Negative for hearing loss.   Eyes: Negative for blurred vision.  Respiratory: Negative for shortness of breath.   Cardiovascular: Negative for chest pain.  Gastrointestinal: Negative for abdominal pain.  Musculoskeletal: Negative for falls.  Skin: Negative for rash.  Neurological: Negative for headaches.  Psychiatric/Behavioral: Negative for depression.   Past Medical History:  Diagnosis Date  . B12 deficiency   . 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 History of chicken pox   . Vitamin D deficiency    Past Surgical History:  Procedure Laterality Date  . CESAREAN SECTION    . TONSILLECTOMY     Family History  Problem Relation Age of Onset  . Arthritis Mother   . Diabetes Father        type 2  . Breast cancer Neg Hx    Social History   Socioeconomic History  . Marital status: Married    Spouse name: Not on file  . Number of children: Not on file  . Years of education: Not on file  . Highest education level: Not on file  Occupational History  . Not on file  Tobacco Use  . Smoking status: Never Smoker  . Smokeless tobacco: Never Used  Substance and Sexual Activity  . Alcohol use: Yes    Comment: men  . Drug use: Not Currently  . Sexual activity: Yes    Partners: Male  Other Topics Concern  . 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 Difficulty of Paying Living  Expenses:   Food Insecurity:   . Worried About Charity fundraiser in the Last Year:   . Arboriculturist in the Last Year:   Transportation Needs:   . Film/video editor (Medical):   Marland Kitchen Lack of Transportation (Non-Medical):   Physical Activity:   . Days of Exercise per Week:   . Minutes of Exercise per Session:   Stress:   . Feeling of Stress :   Social Connections:   . Frequency of Communication with Friends and Family:   . Frequency of Social Gatherings with Friends and Family:   . Attends Religious Services:   . Active Member of Clubs or Organizations:   . Attends Archivist Meetings:   Marland Kitchen Marital Status:   Intimate Partner Violence:   . Fear of Current or Ex-Partner:   . Emotionally Abused:   Marland Kitchen Physically Abused:   . Sexually Abused:    Current Meds  Medication Sig  . cyanocobalamin (,VITAMIN B-12,) 1000 MCG/ML injection Inject 1 mL (1,000 mcg total) into the muscle every 30 (thirty) days.  . Vitamin D, Ergocalciferol, (DRISDOL) 1.25 MG (50000 UNIT) CAPS capsule Take 1 capsule (50,000 Units total) by mouth every 30 (  thirty) days.  . [DISCONTINUED] cyanocobalamin (,VITAMIN B-12,) 1000 MCG/ML injection Inject 1 mL (1,000 mcg total) into the muscle every 30 (thirty) days.  . [DISCONTINUED] Vitamin D, Ergocalciferol, (DRISDOL) 1.25 MG (50000 UT) CAPS capsule Take 50,000 Units by mouth every 7 (seven) days.    No Known Allergies Recent Results (from the past 2160 hour(s))  Comprehensive metabolic panel     Status: None   Collection Time: 02/23/20  9:33 AM  Result Value Ref Range   Sodium 138 135 - 145 mEq/L   Potassium 3.9 3.5 - 5.1 mEq/L   Chloride 104 96 - 112 mEq/L   CO2 28 19 - 32 mEq/L   Glucose, Bld 77 70 - 99 mg/dL   BUN 14 6 - 23 mg/dL   Creatinine, Ser 0.84 0.40 - 1.20 mg/dL   Total Bilirubin 0.7 0.2 - 1.2 mg/dL   Alkaline Phosphatase 56 39 - 117 U/L   AST 18 0 - 37 U/L   ALT 11 0 - 35 U/L   Total Protein 6.8 6.0 - 8.3 g/dL   Albumin 4.3 3.5 - 5.2  g/dL   GFR 71.50 >60.00 mL/min   Calcium 9.7 8.4 - 10.5 mg/dL  Lipid panel     Status: Abnormal   Collection Time: 02/23/20  9:33 AM  Result Value Ref Range   Cholesterol 191 0 - 200 mg/dL    Comment: ATP III Classification       Desirable:  < 200 mg/dL               Borderline High:  200 - 239 mg/dL          High:  > = 240 mg/dL   Triglycerides 51.0 0.0 - 149.0 mg/dL    Comment: Normal:  <150 mg/dLBorderline High:  150 - 199 mg/dL   HDL 70.60 >39.00 mg/dL   VLDL 10.2 0.0 - 40.0 mg/dL   LDL Cholesterol 110 (H) 0 - 99 mg/dL   Total CHOL/HDL Ratio 3     Comment:                Men          Women1/2 Average Risk     3.4          3.3Average Risk          5.0          4.42X Average Risk          9.6          7.13X Average Risk          15.0          11.0                       NonHDL 120.16     Comment: NOTE:  Non-HDL goal should be 30 mg/dL higher than patient's LDL goal (i.e. LDL goal of < 70 mg/dL, would have non-HDL goal of < 100 mg/dL)  TSH     Status: None   Collection Time: 02/23/20  9:33 AM  Result Value Ref Range   TSH 1.40 0.35 - 4.50 uIU/mL  Urinalysis, Routine w reflex microscopic     Status: None   Collection Time: 02/23/20  9:33 AM  Result Value Ref Range   Color, Urine YELLOW YELLOW   APPearance CLEAR CLEAR   Specific Gravity, Urine 1.004 1.001 - 1.03   pH 6.5 5.0 - 8.0   Glucose, UA NEGATIVE NEGATIVE  Bilirubin Urine NEGATIVE NEGATIVE   Ketones, ur NEGATIVE NEGATIVE   Hgb urine dipstick NEGATIVE NEGATIVE   Protein, ur NEGATIVE NEGATIVE   Nitrite NEGATIVE NEGATIVE   Leukocytes,Ua NEGATIVE NEGATIVE  CBC with Differential/Platelet     Status: None   Collection Time: 02/23/20  9:33 AM  Result Value Ref Range   WBC 4.3 4.0 - 10.5 K/uL   RBC 4.71 3.87 - 5.11 Mil/uL   Hemoglobin 13.4 12.0 - 15.0 g/dL   HCT 40.0 36.0 - 46.0 %   MCV 84.8 78.0 - 100.0 fl   MCHC 33.6 30.0 - 36.0 g/dL   RDW 12.6 11.5 - 15.5 %   Platelets 208.0 150.0 - 400.0 K/uL   Neutrophils Relative %  56.9 43.0 - 77.0 %   Lymphocytes Relative 33.0 12.0 - 46.0 %   Monocytes Relative 8.2 3.0 - 12.0 %   Eosinophils Relative 1.3 0.0 - 5.0 %   Basophils Relative 0.6 0.0 - 3.0 %   Neutro Abs 2.4 1.4 - 7.7 K/uL   Lymphs Abs 1.4 0.7 - 4.0 K/uL   Monocytes Absolute 0.3 0.1 - 1.0 K/uL   Eosinophils Absolute 0.1 0.0 - 0.7 K/uL   Basophils Absolute 0.0 0.0 - 0.1 K/uL  Vitamin D (25 hydroxy)     Status: None   Collection Time: 02/23/20  9:33 AM  Result Value Ref Range   VITD 46.22 30.00 - 100.00 ng/mL  B12     Status: None   Collection Time: 02/23/20  9:33 AM  Result Value Ref Range   Vitamin B-12 301 211 - 911 pg/mL   Objective  Body mass index is 19.88 kg/m. Wt Readings from Last 3 Encounters:  03/03/20 115 lb 12.8 oz (52.5 kg)  02/18/19 115 lb (52.2 kg)   Temp Readings from Last 3 Encounters:  03/03/20 97.7 F (36.5 C) (Temporal)  02/18/19 98.3 F (36.8 C) (Oral)   BP Readings from Last 3 Encounters:  03/03/20 100/70  02/18/19 110/68   Pulse Readings from Last 3 Encounters:  03/03/20 64  02/18/19 89    Physical Exam Vitals and nursing note reviewed.  Constitutional:      Appearance: Normal appearance. She is well-developed and well-groomed.  HENT:     Head: Normocephalic and atraumatic.  Eyes:     Conjunctiva/sclera: Conjunctivae normal.     Pupils: Pupils are equal, round, and reactive to light.  Cardiovascular:     Rate and Rhythm: Normal rate and regular rhythm.     Heart sounds: Normal heart sounds. No murmur.  Pulmonary:     Effort: Pulmonary effort is normal.     Breath sounds: Normal breath sounds.  Skin:    General: Skin is warm and dry.  Neurological:     General: No focal deficit present.     Mental Status: She is alert and oriented to person, place, and time. Mental status is at baseline.     Gait: Gait normal.  Psychiatric:        Attention and Perception: Attention and perception normal.        Mood and Affect: Mood and affect normal.         Speech: Speech normal.        Behavior: Behavior normal. Behavior is cooperative.        Thought Content: Thought content normal.        Cognition and Memory: Cognition and memory normal.        Judgment: Judgment normal.  Assessment  Plan  Annual physical exam Per pt flu shot had 09/2019 Tdap 01/07/16  covid 19 vaccine had 1st 2/26 and 1 pending  Had hep A immune  rec hep B new vaccine x 2 doses  rec MMR given Rx today rec shingrix vaccine x 2 doses in future   LMP 05/2018 likely menopause [place] 125   Pap ob/gyn no FH breast cancer  -pap 12/10/16 neg pap neg HPV f/u Dr. Leonides Schanz 11/2019 pap  -cervical bx 12/26/2016 normal benign  -CC Dr. Leonides Schanz when is pap due?    Mammogram 01/21/2019 normal ordered mammogram pt to call and sch Colonoscopy had 09/2019 [facility] Dr. Alice Reichert ROI sent today   Dermatology Dr. Evorn Gong f/u summer 2020 h/o atypical nevi  FSH 125 menopause   Eye MD [facility]  Dentist Dr. Eugenie Birks Derm appt 07/13/19  rec healthy diet and exercise   B12 deficiency - Plan: cyanocobalamin (,VITAMIN B-12,) 1000 MCG/ML injection  Vitamin D deficiency - Plan: Vitamin D, Ergocalciferol, (DRISDOL) 1.25 MG (50000 UNIT) CAPS capsule 1x per month  Provider: Dr. Olivia Mackie McLean-Scocuzza-Internal Medicine

## 2020-03-06 ENCOUNTER — Telehealth: Payer: Self-pay | Admitting: Internal Medicine

## 2020-03-06 NOTE — Telephone Encounter (Signed)
Fax sent with question on cover.

## 2020-03-06 NOTE — Telephone Encounter (Signed)
Received [facility] from [facility] with patient's last colonoscopy and placed on your desk.

## 2020-03-06 NOTE — Telephone Encounter (Signed)
-----   Message from Bevelyn Buckles, MD sent at 03/03/2020  6:13 PM EST ----- Fax note Dr. Kari Baars [facility] ob/gyn need to know when pap is due? Cervical bx 12/26/16 negative ThanksTMS

## 2020-03-08 ENCOUNTER — Telehealth: Payer: Self-pay | Admitting: Internal Medicine

## 2020-03-08 NOTE — Progress Notes (Signed)
Noted pap is due had bx cervical polyp and pap is due now can f/u me or ob/gyn for pap when sch permits   TMS

## 2020-03-08 NOTE — Telephone Encounter (Signed)
Please schedule pap with me or can sch with Dr. Elesa Massed she did not do pap last time but removed cervical polyp so if she wants f/u with her b/c of this can schedule pap with ob/gyn   Otherwise she can schedule pap with me anytime   TMS

## 2020-03-09 NOTE — Telephone Encounter (Signed)
Patient informed and verbalized understanding.  She will call Dr Jolyn Nap office to be scheduled.

## 2020-03-10 ENCOUNTER — Ambulatory Visit: Payer: BC Managed Care – PPO | Attending: Internal Medicine

## 2020-03-10 DIAGNOSIS — Z23 Encounter for immunization: Secondary | ICD-10-CM

## 2020-03-10 NOTE — Progress Notes (Signed)
   Covid-19 Vaccination Clinic  Name:  Vicki Hart    MRN: [id number] DOB: [birth year]  03/10/2020  [name] was observed post Covid-19 immunization for 15 minutes without incident. She was provided with Vaccine Information Sheet and instruction to access the V-Safe system.   [name] was instructed to call 911 with any severe reactions post vaccine: Marland Kitchen Difficulty breathing  . Swelling of face and throat  . A fast heartbeat  . A bad rash all over body  . Dizziness and weakness   Immunizations Administered    Name Date Dose VIS Date Route   Pfizer COVID-19 Vaccine 03/10/2020  4:46 PM 0.3 mL 12/03/2019 Intramuscular   Manufacturer: [employer], [employer]   Lot: [id number]   NDC: [address]-6

## 2020-03-22 ENCOUNTER — Encounter: Payer: Self-pay | Admitting: Internal Medicine

## 2020-05-01 ENCOUNTER — Ambulatory Visit
Admission: RE | Admit: 2020-05-01 | Discharge: 2020-05-01 | Disposition: A | Discharge status: Home / Self Care | Payer: BC Managed Care – PPO | Source: Ambulatory Visit | Attending: Internal Medicine | Admitting: Internal Medicine

## 2020-05-01 DIAGNOSIS — Z1231 Encounter for screening mammogram for malignant neoplasm of breast: Secondary | ICD-10-CM

## 2020-05-31 LAB — HM PAP SMEAR: HM Pap smear: NORMAL

## 2021-02-16 ENCOUNTER — Telehealth: Payer: Self-pay

## 2021-02-16 NOTE — Telephone Encounter (Signed)
Pt has CPE on 3/18 and would like labs prior to that appt.

## 2021-02-16 NOTE — Telephone Encounter (Signed)
Please place orders or let me know which ones you would like done.   Can then schedule Patient

## 2021-02-19 ENCOUNTER — Other Ambulatory Visit: Payer: Self-pay | Admitting: Internal Medicine

## 2021-02-19 DIAGNOSIS — E538 Deficiency of other specified B group vitamins: Secondary | ICD-10-CM

## 2021-02-19 DIAGNOSIS — Z0000 Encounter for general adult medical examination without abnormal findings: Secondary | ICD-10-CM

## 2021-02-19 DIAGNOSIS — Z1329 Encounter for screening for other suspected endocrine disorder: Secondary | ICD-10-CM

## 2021-02-19 DIAGNOSIS — E559 Vitamin D deficiency, unspecified: Secondary | ICD-10-CM

## 2021-02-19 DIAGNOSIS — Z1389 Encounter for screening for other disorder: Secondary | ICD-10-CM

## 2021-02-19 DIAGNOSIS — Z13220 Encounter for screening for lipoid disorders: Secondary | ICD-10-CM

## 2021-02-19 DIAGNOSIS — Z13818 Encounter for screening for other digestive system disorders: Secondary | ICD-10-CM

## 2021-02-19 NOTE — Telephone Encounter (Signed)
Left message to return call.  Please schedule Patient a lab appointment if calling back.

## 2021-02-19 NOTE — Telephone Encounter (Signed)
Sch fasting labs before 03/09/21 appt please

## 2021-02-20 NOTE — Telephone Encounter (Signed)
Patient scheduled for labs 03/02/21 at 9:45

## 2021-03-02 ENCOUNTER — Other Ambulatory Visit (INDEPENDENT_AMBULATORY_CARE_PROVIDER_SITE_OTHER): Payer: BC Managed Care – PPO

## 2021-03-02 ENCOUNTER — Other Ambulatory Visit: Payer: Self-pay

## 2021-03-02 DIAGNOSIS — Z1389 Encounter for screening for other disorder: Secondary | ICD-10-CM

## 2021-03-02 DIAGNOSIS — E559 Vitamin D deficiency, unspecified: Secondary | ICD-10-CM

## 2021-03-02 DIAGNOSIS — Z0000 Encounter for general adult medical examination without abnormal findings: Secondary | ICD-10-CM

## 2021-03-02 DIAGNOSIS — Z13818 Encounter for screening for other digestive system disorders: Secondary | ICD-10-CM

## 2021-03-02 DIAGNOSIS — E538 Deficiency of other specified B group vitamins: Secondary | ICD-10-CM

## 2021-03-02 DIAGNOSIS — Z13220 Encounter for screening for lipoid disorders: Secondary | ICD-10-CM

## 2021-03-02 DIAGNOSIS — Z1329 Encounter for screening for other suspected endocrine disorder: Secondary | ICD-10-CM

## 2021-03-02 LAB — CBC WITH DIFFERENTIAL/PLATELET
Basophils Absolute: 0 10*3/uL (ref 0.0–0.1)
Basophils Relative: 0.5 % (ref 0.0–3.0)
Eosinophils Absolute: 0 10*3/uL (ref 0.0–0.7)
Eosinophils Relative: 0.6 % (ref 0.0–5.0)
HCT: 39.4 % (ref 36.0–46.0)
Hemoglobin: 13.6 g/dL (ref 12.0–15.0)
Lymphocytes Relative: 29.8 % (ref 12.0–46.0)
Lymphs Abs: 1.4 10*3/uL (ref 0.7–4.0)
MCHC: 34.4 g/dL (ref 30.0–36.0)
MCV: 83.6 fl (ref 78.0–100.0)
Monocytes Absolute: 0.4 10*3/uL (ref 0.1–1.0)
Monocytes Relative: 7.6 % (ref 3.0–12.0)
Neutro Abs: 3 10*3/uL (ref 1.4–7.7)
Neutrophils Relative %: 61.5 % (ref 43.0–77.0)
Platelets: 201 10*3/uL (ref 150.0–400.0)
RBC: 4.72 Mil/uL (ref 3.87–5.11)
RDW: 12.6 % (ref 11.5–15.5)
WBC: 4.9 10*3/uL (ref 4.0–10.5)

## 2021-03-02 LAB — VITAMIN B12: Vitamin B-12: 258 pg/mL (ref 211–911)

## 2021-03-02 LAB — COMPREHENSIVE METABOLIC PANEL
ALT: 10 U/L (ref 0–35)
AST: 15 U/L (ref 0–37)
Albumin: 4.4 g/dL (ref 3.5–5.2)
Alkaline Phosphatase: 60 U/L (ref 39–117)
BUN: 15 mg/dL (ref 6–23)
CO2: 29 mEq/L (ref 19–32)
Calcium: 9.6 mg/dL (ref 8.4–10.5)
Chloride: 104 mEq/L (ref 96–112)
Creatinine, Ser: 0.82 mg/dL (ref 0.40–1.20)
GFR: 82.5 mL/min (ref >60.00)
Glucose, Bld: 89 mg/dL (ref 70–99)
Potassium: 4.1 mEq/L (ref 3.5–5.1)
Sodium: 140 mEq/L (ref 135–145)
Total Bilirubin: 0.6 mg/dL (ref 0.2–1.2)
Total Protein: 6.7 g/dL (ref 6.0–8.3)

## 2021-03-02 LAB — LIPID PANEL
Cholesterol: 206 mg/dL — ABNORMAL HIGH (ref 0–200)
HDL: 72.9 mg/dL (ref >39.00)
LDL Cholesterol: 123 mg/dL — ABNORMAL HIGH (ref 0–99)
NonHDL: 133.15
Total CHOL/HDL Ratio: 3
Triglycerides: 51 mg/dL (ref 0.0–149.0)
VLDL: 10.2 mg/dL (ref 0.0–40.0)

## 2021-03-02 LAB — TSH: TSH: 1.29 u[IU]/mL (ref 0.35–4.50)

## 2021-03-02 LAB — HEPATITIS C ANTIBODY
Hepatitis C Ab: NONREACTIVE
SIGNAL TO CUT-OFF: 0.01 (ref <1.00)

## 2021-03-02 LAB — VITAMIN D 25 HYDROXY (VIT D DEFICIENCY, FRACTURES): VITD: 28.78 ng/mL — ABNORMAL LOW (ref 30.00–100.00)

## 2021-03-03 LAB — URINALYSIS, ROUTINE W REFLEX MICROSCOPIC
Bacteria, UA: NONE SEEN /HPF
Bilirubin Urine: NEGATIVE
Glucose, UA: NEGATIVE
Hgb urine dipstick: NEGATIVE
Hyaline Cast: NONE SEEN /LPF
Ketones, ur: NEGATIVE
Leukocytes,Ua: NEGATIVE
Nitrite: NEGATIVE
Protein, ur: NEGATIVE
RBC / HPF: NONE SEEN /HPF (ref 0–2)
Specific Gravity, Urine: 1.016 (ref 1.001–1.03)
Squamous Epithelial / HPF: NONE SEEN /HPF
WBC, UA: NONE SEEN /HPF (ref 0–5)
pH: <=5 (ref 5.0–8.0)

## 2021-03-06 ENCOUNTER — Encounter: Payer: BC Managed Care – PPO | Admitting: Internal Medicine

## 2021-03-07 ENCOUNTER — Other Ambulatory Visit: Payer: Self-pay | Admitting: Internal Medicine

## 2021-03-07 DIAGNOSIS — E538 Deficiency of other specified B group vitamins: Secondary | ICD-10-CM

## 2021-03-09 ENCOUNTER — Ambulatory Visit (INDEPENDENT_AMBULATORY_CARE_PROVIDER_SITE_OTHER): Payer: BC Managed Care – PPO | Admitting: Internal Medicine

## 2021-03-09 ENCOUNTER — Other Ambulatory Visit: Payer: Self-pay

## 2021-03-09 ENCOUNTER — Encounter: Payer: Self-pay | Admitting: Internal Medicine

## 2021-03-09 VITALS — BP 98/70 | HR 80 | Temp 97.9°F | Ht 63.39 in | Wt 117.4 lb

## 2021-03-09 DIAGNOSIS — Z0000 Encounter for general adult medical examination without abnormal findings: Secondary | ICD-10-CM

## 2021-03-09 DIAGNOSIS — E538 Deficiency of other specified B group vitamins: Secondary | ICD-10-CM

## 2021-03-09 DIAGNOSIS — E559 Vitamin D deficiency, unspecified: Secondary | ICD-10-CM

## 2021-03-09 DIAGNOSIS — Z1389 Encounter for screening for other disorder: Secondary | ICD-10-CM

## 2021-03-09 DIAGNOSIS — E785 Hyperlipidemia, unspecified: Secondary | ICD-10-CM

## 2021-03-09 DIAGNOSIS — Z1231 Encounter for screening mammogram for malignant neoplasm of breast: Secondary | ICD-10-CM

## 2021-03-09 DIAGNOSIS — E2839 Other primary ovarian failure: Secondary | ICD-10-CM

## 2021-03-09 DIAGNOSIS — Z1329 Encounter for screening for other suspected endocrine disorder: Secondary | ICD-10-CM

## 2021-03-09 MED ORDER — CYANOCOBALAMIN 1000 MCG/ML IJ SOLN: 1000.0000 ug | INTRAMUSCULAR | 11 refills | Status: DC

## 2021-03-09 MED ORDER — VITAMIN D (ERGOCALCIFEROL) 1.25 MG (50000 UNIT) PO CAPS: 50000.0000 [IU] | ORAL_CAPSULE | ORAL | 3 refills | Status: DC

## 2021-03-09 NOTE — Telephone Encounter (Signed)
Printed and handed to Dr French Ana McLean-Scocuzza for Patient's appointment in office today.

## 2021-03-09 NOTE — Progress Notes (Signed)
Chief Complaint  Patient presents with  . Annual Exam   Annual  1. Doing well will order DEXA and mammogram  2. Reviewed labs 02/2021 +HLD FH mom and dad HLD and vitamin D def   Results for RAHAF, CARBONELL (MRN [id number]) as of 03/12/2021 10:36  03/02/2021 09:45 Lipid panel Cholesterol: 206 (H) HDL Cholesterol: 72.90 LDL (calc): 123 (H) NonHDL: 133.15 Triglycerides: 51.0 VLDL: 10.2   VITD: 28.78 (L) Vitamin B12: 258   Review of Systems  Constitutional: Negative for weight loss.  HENT: Negative for hearing loss.   Eyes: Negative for blurred vision.  Respiratory: Negative for shortness of breath.   Cardiovascular: Negative for chest pain.  Gastrointestinal: Negative for abdominal pain.  Musculoskeletal: Negative for falls and joint pain.  Skin: Negative for rash.  Neurological: Negative for headaches.  Psychiatric/Behavioral: Negative for depression.   Past Medical History:  Diagnosis Date  . B12 deficiency   . Heart murmur    MVP dx'ed age 22 no echo since   . History of chicken pox   . Vitamin D deficiency    Past Surgical History:  Procedure Laterality Date  . CESAREAN SECTION    . TONSILLECTOMY     Family History  Problem Relation Age of Onset  . Arthritis Mother   . Hyperlipidemia Mother   . Diabetes Father        type 2  . Hyperlipidemia Father   . Breast cancer Neg Hx    Social History   Socioeconomic History  . Marital status: Married    Spouse name: Not on file  . Number of children: Not on file  . Years of education: Not on file  . Highest education level: Not on file  Occupational History  . Not on file  Tobacco Use  . Smoking status: Never Smoker  . Smokeless tobacco: Never Used  Substance and Sexual Activity  . Alcohol use: Yes    Comment: men  . Drug use: Not Currently  . Sexual activity: Yes    Partners: Male  Other Topics Concern  . 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 [DISCONTINUED] cyanocobalamin (,VITAMIN B-12,) 1000 MCG/ML injection INJECT 1ML INTO MUSCLE EVERY 30 DAYS  . [DISCONTINUED] Vitamin D, Ergocalciferol, (DRISDOL) 1.25 MG (50000 UNIT) CAPS capsule Take 1 capsule (50,000 Units total) by mouth every 30 (thirty) days.   No Known Allergies Recent Results (from the past 2160 hour(s))  B12     Status: None   Collection Time: 03/02/21  9:45 AM  Result Value Ref Range   Vitamin B-12 258 211 - 911 pg/mL  Vitamin D (25 hydroxy)     Status: Abnormal   Collection Time: 03/02/21  9:45 AM  Result Value Ref Range   VITD 28.78 (L) 30.00 - 100.00 ng/mL  Hepatitis C antibody     Status: None   Collection Time: 03/02/21  9:45 AM  Result Value Ref Range   Hepatitis C Ab NON-REACTIVE NON-REACTI   SIGNAL TO CUT-OFF 0.01 <1.00    Comment: . HCV antibody was non-reactive. There is no laboratory  evidence of HCV infection. . In most cases, no further action is required. However, if recent HCV exposure is suspected, a test for HCV RNA (test code [phone]) is suggested. . For additional information please refer to http://education.questdiagnostics.com/faq/FAQ22v1 (This link is being provided for informational/ educational purposes only.) .   Urinalysis, Routine w reflex microscopic     Status: None   Collection Time: 03/02/21  9:45 AM  Result Value Ref Range   Color, Urine YELLOW YELLOW   APPearance CLEAR CLEAR   Specific Gravity, Urine 1.016 1.001 - 1.03   pH < OR = 5.0 5.0 - 8.0   Glucose, UA NEGATIVE NEGATIVE   Bilirubin Urine NEGATIVE  NEGATIVE   Ketones, ur NEGATIVE NEGATIVE   Hgb urine dipstick NEGATIVE NEGATIVE   Protein, ur NEGATIVE NEGATIVE   Nitrite NEGATIVE NEGATIVE   Leukocytes,Ua NEGATIVE NEGATIVE   WBC, UA NONE SEEN 0 - 5 /HPF   RBC / HPF NONE SEEN 0 - 2 /HPF   Squamous Epithelial / LPF NONE SEEN < OR = 5 /HPF   Bacteria, UA NONE SEEN NONE SEEN /HPF   Hyaline Cast NONE SEEN NONE SEEN /LPF  TSH     Status: None   Collection Time: 03/02/21  9:45 AM  Result Value Ref Range   TSH 1.29 0.35 - 4.50 uIU/mL  CBC with Differential/Platelet     Status: None   Collection Time: 03/02/21  9:45 AM  Result Value Ref Range   WBC 4.9 4.0 - 10.5 K/uL   RBC 4.72 3.87 - 5.11 Mil/uL   Hemoglobin 13.6 12.0 - 15.0 g/dL   HCT 39.4 36.0 - 46.0 %   MCV 83.6 78.0 - 100.0 fl   MCHC 34.4 30.0 - 36.0 g/dL   RDW 12.6 11.5 - 15.5 %   Platelets 201.0 150.0 - 400.0 K/uL   Neutrophils Relative % 61.5 43.0 - 77.0 %   Lymphocytes Relative 29.8 12.0 - 46.0 %   Monocytes Relative 7.6 3.0 - 12.0 %   Eosinophils Relative 0.6 0.0 - 5.0 %   Basophils Relative 0.5 0.0 - 3.0 %   Neutro Abs 3.0 1.4 - 7.7 K/uL   Lymphs Abs 1.4 0.7 - 4.0 K/uL   Monocytes Absolute 0.4 0.1 - 1.0 K/uL   Eosinophils Absolute 0.0 0.0 - 0.7 K/uL   Basophils Absolute 0.0 0.0 - 0.1 K/uL  Lipid panel     Status: Abnormal   Collection Time: 03/02/21  9:45 AM  Result Value Ref Range   Cholesterol 206 (H) 0 - 200 mg/dL    Comment: ATP III Classification       Desirable:  < 200 mg/dL               Borderline High:  200 - 239 mg/dL          High:  > = 240 mg/dL   Triglycerides 51.0 0.0 - 149.0 mg/dL    Comment: Normal:  <150 mg/dLBorderline High:  150 - 199 mg/dL   HDL 72.90 >39.00 mg/dL   VLDL 10.2 0.0 - 40.0 mg/dL   LDL Cholesterol 123 (H) 0 - 99 mg/dL   Total CHOL/HDL Ratio 3     Comment:                Men  Women1/2 Average Risk     3.4          3.3Average Risk          5.0          4.42X Average Risk          9.6          7.13X Average Risk          15.0           11.0                       NonHDL 133.15     Comment: NOTE:  Non-HDL goal should be 30 mg/dL higher than patient's LDL goal (i.e. LDL goal of < 70 mg/dL, would have non-HDL goal of < 100 mg/dL)  Comprehensive metabolic panel     Status: None   Collection Time: 03/02/21  9:45 AM  Result Value Ref Range   Sodium 140 135 - 145 mEq/L   Potassium 4.1 3.5 - 5.1 mEq/L   Chloride 104 96 - 112 mEq/L   CO2 29 19 - 32 mEq/L   Glucose, Bld 89 70 - 99 mg/dL   BUN 15 6 - 23 mg/dL   Creatinine, Ser 0.82 0.40 - 1.20 mg/dL   Total Bilirubin 0.6 0.2 - 1.2 mg/dL   Alkaline Phosphatase 60 39 - 117 U/L   AST 15 0 - 37 U/L   ALT 10 0 - 35 U/L   Total Protein 6.7 6.0 - 8.3 g/dL   Albumin 4.4 3.5 - 5.2 g/dL   GFR 82.50 >60.00 mL/min    Comment: Calculated using the CKD-EPI Creatinine Equation (2021)   Calcium 9.6 8.4 - 10.5 mg/dL   Objective  Body mass index is 20.54 kg/m. Wt Readings from Last 3 Encounters:  03/09/21 117 lb 6.4 oz (53.3 kg)  03/03/20 115 lb 12.8 oz (52.5 kg)  02/18/19 115 lb (52.2 kg)   Temp Readings from Last 3 Encounters:  03/09/21 97.9 F (36.6 C) (Oral)  03/03/20 97.7 F (36.5 C) (Temporal)  02/18/19 98.3 F (36.8 C) (Oral)   BP Readings from Last 3 Encounters:  03/09/21 98/70  03/03/20 100/70  02/18/19 110/68   Pulse Readings from Last 3 Encounters:  03/09/21 80  03/03/20 64  02/18/19 89    Physical Exam Vitals and nursing note reviewed.  Constitutional:      Appearance: Normal appearance. She is well-developed and well-groomed.  HENT:     Head: Normocephalic and atraumatic.  Eyes:     Conjunctiva/sclera: Conjunctivae normal.     Pupils: Pupils are equal, round, and reactive to light.  Cardiovascular:     Rate and Rhythm: Normal rate and regular rhythm.     Heart sounds: Normal heart sounds. No murmur heard.   Pulmonary:     Effort: Pulmonary effort is normal.     Breath sounds: Normal breath sounds.  Chest:  Breasts: Breasts are symmetrical.      Right: Normal. No axillary adenopathy.     Left: Normal. No axillary adenopathy.    Abdominal:     Tenderness: There is no abdominal tenderness.  Lymphadenopathy:     Upper Body:     Right upper body: No axillary adenopathy.     Left upper body: No axillary adenopathy.  Skin:    General: Skin is warm and dry.  Neurological:     General: No focal deficit present.     Mental Status: She  is alert and oriented to person, place, and time. Mental status is at baseline.     Gait: Gait normal.  Psychiatric:        Attention and Perception: Attention and perception normal.        Mood and Affect: Mood and affect normal.        Speech: Speech normal.        Behavior: Behavior normal. Behavior is cooperative.        Thought Content: Thought content normal.        Cognition and Memory: Cognition and memory normal.        Judgment: Judgment normal.     Assessment  Plan  Annual physical exam Flu shot 09/08/20  Tdap 01/07/16  covid 19 pfizer 3/3 Had hep A immune  rec hep B new vaccine x 2 doses  recMMRgiven Rx prev rec shingrix vaccine x 2 doses in future   LMP 05/2018 likely [place] 125 Pap ob/gyn no FH breast cancer  -pap 12/10/16 neg pap neg HPV f/u Dr. Leonides Schanz 11/2019 pap  Pap 05/31/20 neg pap neg HPV [facility] ob/gyn  Mammogram 01/21/2019 normal ordered mammogram pt to call and sch 05/01/20 negative ordered  dexa ordered   Colonoscopy had 10/12/19 [facility] Dr. Alice Reichert hyperplastic polyp  Dermatology Dr. Evorn Gong f/u summer 2020 h/o atypical nevi 06/2020 normal exam f/u 1 year   [facility] 125 menopause  Eye MD [facility] appt 02/26/21 Dentist Dr. Eugenie Birks Derm appt 07/13/19 rec healthy diet and exercise   Vitamin D deficiency - Plan: Vitamin D, Ergocalciferol, (DRISDOL) 1.25 MG (50000 UNIT) CAPS capsule  B12 deficiency - Plan: cyanocobalamin (,VITAMIN B-12,) 1000 MCG/ML injection  Hyperlipidemia, unspecified hyperlipidemia type  Healthy diet and exercise  ASCVD risk  calc no statin indicated  monitor   Provider: Dr. Olivia Mackie McLean-Scocuzza-Internal Medicine

## 2021-03-09 NOTE — Patient Instructions (Signed)
05/01/20 mammo and bone density [facility]

## 2021-03-12 ENCOUNTER — Encounter: Payer: Self-pay | Admitting: Internal Medicine

## 2021-03-12 DIAGNOSIS — E785 Hyperlipidemia, unspecified: Secondary | ICD-10-CM | POA: Insufficient documentation

## 2021-05-08 ENCOUNTER — Ambulatory Visit
Admission: RE | Admit: 2021-05-08 | Discharge: 2021-05-08 | Disposition: A | Discharge status: Home / Self Care | Payer: BC Managed Care – PPO | Source: Ambulatory Visit | Attending: Internal Medicine | Admitting: Internal Medicine

## 2021-05-08 ENCOUNTER — Other Ambulatory Visit: Payer: Self-pay

## 2021-05-08 DIAGNOSIS — Z1231 Encounter for screening mammogram for malignant neoplasm of breast: Secondary | ICD-10-CM | POA: Diagnosis present

## 2021-05-08 DIAGNOSIS — E2839 Other primary ovarian failure: Secondary | ICD-10-CM | POA: Diagnosis not present

## 2021-05-09 ENCOUNTER — Other Ambulatory Visit: Payer: Self-pay | Admitting: Internal Medicine

## 2021-05-09 DIAGNOSIS — N6489 Other specified disorders of breast: Secondary | ICD-10-CM

## 2021-05-09 DIAGNOSIS — R928 Other abnormal and inconclusive findings on diagnostic imaging of breast: Secondary | ICD-10-CM

## 2021-05-16 ENCOUNTER — Other Ambulatory Visit: Payer: Self-pay

## 2021-05-16 ENCOUNTER — Ambulatory Visit
Admission: RE | Admit: 2021-05-16 | Discharge: 2021-05-16 | Disposition: A | Discharge status: Home / Self Care | Payer: BC Managed Care – PPO | Source: Ambulatory Visit | Attending: Internal Medicine | Admitting: Internal Medicine

## 2021-05-16 DIAGNOSIS — N6489 Other specified disorders of breast: Secondary | ICD-10-CM

## 2021-05-16 DIAGNOSIS — R928 Other abnormal and inconclusive findings on diagnostic imaging of breast: Secondary | ICD-10-CM

## 2021-09-13 ENCOUNTER — Encounter: Payer: Self-pay | Admitting: Internal Medicine

## 2022-02-11 ENCOUNTER — Telehealth: Payer: Self-pay | Admitting: Internal Medicine

## 2022-02-11 NOTE — Telephone Encounter (Signed)
Pt want labs done before her physical appt

## 2022-02-11 NOTE — Telephone Encounter (Signed)
Last labs done 03/02/21. Would you like to complete same lab orders from that day? If yes can place orders and call Patient to schedule .  Physical scheduled for 03/15/22

## 2022-02-12 NOTE — Addendum Note (Signed)
Addended by: Quentin Ore on: 02/12/2022 12:49 PM   Modules accepted: Orders

## 2022-02-12 NOTE — Telephone Encounter (Signed)
Labs in please call and sch before next appt thanks

## 2022-02-15 NOTE — Telephone Encounter (Signed)
Scheduled

## 2022-03-08 ENCOUNTER — Other Ambulatory Visit: Payer: Self-pay

## 2022-03-08 ENCOUNTER — Other Ambulatory Visit (INDEPENDENT_AMBULATORY_CARE_PROVIDER_SITE_OTHER): Payer: BC Managed Care – PPO

## 2022-03-08 DIAGNOSIS — E538 Deficiency of other specified B group vitamins: Secondary | ICD-10-CM

## 2022-03-08 DIAGNOSIS — E785 Hyperlipidemia, unspecified: Secondary | ICD-10-CM

## 2022-03-08 DIAGNOSIS — E559 Vitamin D deficiency, unspecified: Secondary | ICD-10-CM

## 2022-03-08 DIAGNOSIS — Z0000 Encounter for general adult medical examination without abnormal findings: Secondary | ICD-10-CM

## 2022-03-08 DIAGNOSIS — Z1329 Encounter for screening for other suspected endocrine disorder: Secondary | ICD-10-CM | POA: Diagnosis not present

## 2022-03-08 DIAGNOSIS — Z1389 Encounter for screening for other disorder: Secondary | ICD-10-CM

## 2022-03-08 LAB — CBC WITH DIFFERENTIAL/PLATELET
Basophils Absolute: 0 10*3/uL (ref 0.0–0.1)
Basophils Relative: 0.4 % (ref 0.0–3.0)
Eosinophils Absolute: 0 10*3/uL (ref 0.0–0.7)
Eosinophils Relative: 0.5 % (ref 0.0–5.0)
HCT: 38 % (ref 36.0–46.0)
Hemoglobin: 12.8 g/dL (ref 12.0–15.0)
Lymphocytes Relative: 18.6 % (ref 12.0–46.0)
Lymphs Abs: 1.3 10*3/uL (ref 0.7–4.0)
MCHC: 33.7 g/dL (ref 30.0–36.0)
MCV: 85.5 fl (ref 78.0–100.0)
Monocytes Absolute: 0.4 10*3/uL (ref 0.1–1.0)
Monocytes Relative: 5.2 % (ref 3.0–12.0)
Neutro Abs: 5.2 10*3/uL (ref 1.4–7.7)
Neutrophils Relative %: 75.3 % (ref 43.0–77.0)
Platelets: 217 10*3/uL (ref 150.0–400.0)
RBC: 4.44 Mil/uL (ref 3.87–5.11)
RDW: 13.3 % (ref 11.5–15.5)
WBC: 6.9 10*3/uL (ref 4.0–10.5)

## 2022-03-08 LAB — COMPREHENSIVE METABOLIC PANEL
ALT: 17 U/L (ref 0–35)
AST: 23 U/L (ref 0–37)
Albumin: 4.5 g/dL (ref 3.5–5.2)
Alkaline Phosphatase: 48 U/L (ref 39–117)
BUN: 13 mg/dL (ref 6–23)
CO2: 29 mEq/L (ref 19–32)
Calcium: 9.8 mg/dL (ref 8.4–10.5)
Chloride: 103 mEq/L (ref 96–112)
Creatinine, Ser: 0.81 mg/dL (ref 0.40–1.20)
GFR: 83.13 mL/min (ref >60.00)
Glucose, Bld: 78 mg/dL (ref 70–99)
Potassium: 3.9 mEq/L (ref 3.5–5.1)
Sodium: 139 mEq/L (ref 135–145)
Total Bilirubin: 0.8 mg/dL (ref 0.2–1.2)
Total Protein: 6.6 g/dL (ref 6.0–8.3)

## 2022-03-08 LAB — LIPID PANEL
Cholesterol: 198 mg/dL (ref 0–200)
HDL: 67.5 mg/dL (ref >39.00)
LDL Cholesterol: 119 mg/dL — ABNORMAL HIGH (ref 0–99)
NonHDL: 130.91
Total CHOL/HDL Ratio: 3
Triglycerides: 60 mg/dL (ref 0.0–149.0)
VLDL: 12 mg/dL (ref 0.0–40.0)

## 2022-03-08 LAB — VITAMIN B12: Vitamin B-12: 468 pg/mL (ref 211–911)

## 2022-03-08 LAB — TSH: TSH: 0.82 u[IU]/mL (ref 0.35–5.50)

## 2022-03-08 LAB — VITAMIN D 25 HYDROXY (VIT D DEFICIENCY, FRACTURES): VITD: 39.2 ng/mL (ref 30.00–100.00)

## 2022-03-09 LAB — URINALYSIS, ROUTINE W REFLEX MICROSCOPIC
Bacteria, UA: NONE SEEN /HPF
Bilirubin Urine: NEGATIVE
Glucose, UA: NEGATIVE
Hgb urine dipstick: NEGATIVE
Hyaline Cast: NONE SEEN /LPF
Nitrite: NEGATIVE
Protein, ur: NEGATIVE
RBC / HPF: NONE SEEN /HPF (ref 0–2)
Specific Gravity, Urine: 1.008 (ref 1.001–1.035)
Squamous Epithelial / HPF: NONE SEEN /HPF (ref <=5)
pH: 5.5 (ref 5.0–8.0)

## 2022-03-15 ENCOUNTER — Encounter: Payer: Self-pay | Admitting: Internal Medicine

## 2022-03-15 ENCOUNTER — Other Ambulatory Visit: Payer: Self-pay

## 2022-03-15 ENCOUNTER — Ambulatory Visit (INDEPENDENT_AMBULATORY_CARE_PROVIDER_SITE_OTHER): Payer: BC Managed Care – PPO | Admitting: Internal Medicine

## 2022-03-15 VITALS — BP 100/70 | HR 79 | Temp 98.7°F | Resp 14 | Ht 63.39 in | Wt 115.0 lb

## 2022-03-15 DIAGNOSIS — Z0000 Encounter for general adult medical examination without abnormal findings: Secondary | ICD-10-CM | POA: Diagnosis not present

## 2022-03-15 DIAGNOSIS — E785 Hyperlipidemia, unspecified: Secondary | ICD-10-CM

## 2022-03-15 DIAGNOSIS — Z23 Encounter for immunization: Secondary | ICD-10-CM

## 2022-03-15 DIAGNOSIS — Z1389 Encounter for screening for other disorder: Secondary | ICD-10-CM

## 2022-03-15 DIAGNOSIS — E559 Vitamin D deficiency, unspecified: Secondary | ICD-10-CM

## 2022-03-15 DIAGNOSIS — E611 Iron deficiency: Secondary | ICD-10-CM

## 2022-03-15 DIAGNOSIS — Z1231 Encounter for screening mammogram for malignant neoplasm of breast: Secondary | ICD-10-CM

## 2022-03-15 DIAGNOSIS — E538 Deficiency of other specified B group vitamins: Secondary | ICD-10-CM | POA: Diagnosis not present

## 2022-03-15 DIAGNOSIS — Z13220 Encounter for screening for lipoid disorders: Secondary | ICD-10-CM

## 2022-03-15 DIAGNOSIS — T148XXA Other injury of unspecified body region, initial encounter: Secondary | ICD-10-CM

## 2022-03-15 DIAGNOSIS — Z1329 Encounter for screening for other suspected endocrine disorder: Secondary | ICD-10-CM

## 2022-03-15 MED ORDER — VITAMIN D (ERGOCALCIFEROL) 1.25 MG (50000 UNIT) PO CAPS: 50000.0000 [IU] | ORAL_CAPSULE | ORAL | 3 refills | Status: DC

## 2022-03-15 MED ORDER — SHINGRIX 50 MCG/0.5ML IM SUSR: 0.5000 mL | Freq: Once | INTRAMUSCULAR | 1 refills | Status: AC

## 2022-03-15 MED ORDER — CYANOCOBALAMIN 1000 MCG/ML IJ SOLN: 1000.0000 ug | INTRAMUSCULAR | 11 refills | Status: DC

## 2022-03-15 MED ORDER — MUPIROCIN 2 % EX OINT: 1.0000 "application " | TOPICAL_OINTMENT | Freq: Two times a day (BID) | CUTANEOUS | 0 refills | Status: DC

## 2022-03-15 NOTE — Patient Instructions (Addendum)
Dr. Benjaman Kindler 06/01/2023  ?[place], [place] [id number]   ?[phone]    ?[facility]   ?[place]   ?[place], [place] [address]   ?[phone] (Work)   ?[phone] (Fax)    ? ? ?Bactine max -[facility] antiseptic spray/lidocaine ?

## 2022-03-15 NOTE — Progress Notes (Signed)
Chief Complaint  ?Patient presents with  ? Annual Exam  ?  No concerns, denies any pain. Labs completed 03/08/22  ? ?Annual doing well  ?1. Wants to get shingirx vaccine summer  ?2. Reviewed labs 03/08/22 hld but ldl improved 119, vit D and B12 improved on supplementation  ? ? ?Review of Systems  ?Constitutional:  Negative for weight loss.  ?HENT:  Negative for hearing loss.   ?Eyes:  Negative for blurred vision.  ?Respiratory:  Negative for shortness of breath.   ?Cardiovascular:  Negative for chest pain.  ?Gastrointestinal:  Negative for abdominal pain and blood in stool.  ?Genitourinary:  Negative for dysuria.  ?Musculoskeletal:  Negative for falls and joint pain.  ?Skin:  Negative for rash.  ?Neurological:  Negative for headaches.  ?Psychiatric/Behavioral:  Negative for depression.   ?Past Medical History:  ?Diagnosis Date  ? B12 deficiency   ? Heart murmur   ? MVP dx'ed age 51 no echo since   ? History of chicken pox   ? Vitamin D deficiency   ? ?Past Surgical History:  ?Procedure Laterality Date  ? CESAREAN SECTION    ? TONSILLECTOMY    ? ?Family History  ?Problem Relation Age of Onset  ? Arthritis Mother   ? Hyperlipidemia Mother   ? Diabetes Father   ?     type 2  ? Hyperlipidemia Father   ? Breast cancer Neg Hx   ? ?Social History  ? ?Socioeconomic History  ? Marital status: Married  ?  Spouse name: Not on file  ? Number of children: Not on file  ? Years of education: Not on file  ? Highest education level: Not on file  ?Occupational History  ? Not on file  ?Tobacco Use  ? Smoking status: Never  ? Smokeless tobacco: Never  ?Substance and Sexual Activity  ? Alcohol use: Yes  ?  Comment: men  ? Drug use: Not Currently  ? Sexual activity: Yes  ?  Partners: Male  ?Other Topics Concern  ? Not on file  ?Social History Narrative  ? Masters   ? Works Bankerteacher intervention instructor K-5   ? Married   ? 2 kids   ? No guns   ? Wears seat belt   ? Safe in relationship   ? Likes mission trips   ? ?Social Determinants of  Health  ? ?Financial Resource Strain: Not on file  ?Food Insecurity: Not on file  ?Transportation Needs: Not on file  ?Physical Activity: Not on file  ?Stress: Not on file  ?Social Connections: Not on file  ?Intimate Partner Violence: Not on file  ? ?Current Meds  ?Medication Sig  ? mupirocin ointment (BACTROBAN) 2 % Apply 1 application. topically 2 (two) times daily. Right inner ankle  ? Zoster Vaccine Adjuvanted [facility](SHINGRIX) injection Inject 0.5 mLs into the muscle once for 1 dose.  ? [DISCONTINUED] cyanocobalamin (,VITAMIN B-12,) 1000 MCG/ML injection Inject 1 mL (1,000 mcg total) into the skin every 30 (thirty) days.  ? [DISCONTINUED] Vitamin D, Ergocalciferol, (DRISDOL) 1.25 MG (50000 UNIT) CAPS capsule Take 1 capsule (50,000 Units total) by mouth every 30 (thirty) days.  ? ?No Known Allergies ?Recent Results (from the past 2160 hour(s))  ?Vitamin B12     Status: None  ? Collection Time: 03/08/22  9:37 AM  ?Result Value Ref Range  ? Vitamin B-12 468 211 - 911 pg/mL  ?Vitamin D (25 hydroxy)     Status: None  ? Collection Time: 03/08/22  9:37  AM  ?Result Value Ref Range  ? VITD 39.20 30.00 - 100.00 ng/mL  ?CBC with Differential/Platelet     Status: None  ? Collection Time: 03/08/22  9:37 AM  ?Result Value Ref Range  ? WBC 6.9 4.0 - 10.5 K/uL  ? RBC 4.44 3.87 - 5.11 Mil/uL  ? Hemoglobin 12.8 12.0 - 15.0 g/dL  ? HCT 38.0 36.0 - 46.0 %  ? MCV 85.5 78.0 - 100.0 fl  ? MCHC 33.7 30.0 - 36.0 g/dL  ? RDW 13.3 11.5 - 15.5 %  ? Platelets 217.0 150.0 - 400.0 K/uL  ? Neutrophils Relative % 75.3 43.0 - 77.0 %  ? Lymphocytes Relative 18.6 12.0 - 46.0 %  ? Monocytes Relative 5.2 3.0 - 12.0 %  ? Eosinophils Relative 0.5 0.0 - 5.0 %  ? Basophils Relative 0.4 0.0 - 3.0 %  ? Neutro Abs 5.2 1.4 - 7.7 K/uL  ? Lymphs Abs 1.3 0.7 - 4.0 K/uL  ? Monocytes Absolute 0.4 0.1 - 1.0 K/uL  ? Eosinophils Absolute 0.0 0.0 - 0.7 K/uL  ? Basophils Absolute 0.0 0.0 - 0.1 K/uL  ?Urinalysis, Routine w reflex microscopic     Status: Abnormal  ?  Collection Time: 03/08/22  9:37 AM  ?Result Value Ref Range  ? Color, Urine YELLOW YELLOW  ? APPearance CLEAR CLEAR  ? Specific Gravity, Urine 1.008 1.001 - 1.035  ? pH 5.5 5.0 - 8.0  ? Glucose, UA NEGATIVE NEGATIVE  ? Bilirubin Urine NEGATIVE NEGATIVE  ? Ketones, ur TRACE (A) NEGATIVE  ? Hgb urine dipstick NEGATIVE NEGATIVE  ? Protein, ur NEGATIVE NEGATIVE  ? Nitrite NEGATIVE NEGATIVE  ? Leukocytes,Ua 1+ (A) NEGATIVE  ? WBC, UA 0-5 0 - 5 /HPF  ? RBC / HPF NONE SEEN 0 - 2 /HPF  ? Squamous Epithelial / LPF NONE SEEN < OR = 5 /HPF  ? Bacteria, UA NONE SEEN NONE SEEN /HPF  ? Hyaline Cast NONE SEEN NONE SEEN /LPF  ?TSH     Status: None  ? Collection Time: 03/08/22  9:37 AM  ?Result Value Ref Range  ? TSH 0.82 0.35 - 5.50 uIU/mL  ?Lipid panel     Status: Abnormal  ? Collection Time: 03/08/22  9:37 AM  ?Result Value Ref Range  ? Cholesterol 198 0 - 200 mg/dL  ?  Comment: ATP III Classification       Desirable:  < 200 mg/dL               Borderline High:  200 - 239 mg/dL          High:  > = 756 mg/dL  ? Triglycerides 60.0 0.0 - 149.0 mg/dL  ?  Comment: Normal:  <150 mg/dLBorderline High:  150 - 199 mg/dL  ? HDL 67.50 >39.00 mg/dL  ? VLDL 12.0 0.0 - 40.0 mg/dL  ? LDL Cholesterol 119 (H) 0 - 99 mg/dL  ? Total CHOL/HDL Ratio 3   ?  Comment:                Men          Women1/2 Average Risk     3.4          3.3Average Risk          5.0          4.42X Average Risk          9.6          7.13X Average Risk  15.0          11.0                      ? NonHDL 130.91   ?  Comment: NOTE:  Non-HDL goal should be 30 mg/dL higher than patient's LDL goal (i.e. LDL goal of < 70 mg/dL, would have non-HDL goal of < 100 mg/dL)  ?Comprehensive metabolic panel     Status: None  ? Collection Time: 03/08/22  9:37 AM  ?Result Value Ref Range  ? Sodium 139 135 - 145 mEq/L  ? Potassium 3.9 3.5 - 5.1 mEq/L  ? Chloride 103 96 - 112 mEq/L  ? CO2 29 19 - 32 mEq/L  ? Glucose, Bld 78 70 - 99 mg/dL  ? BUN 13 6 - 23 mg/dL  ? Creatinine, Ser 0.81 0.40  - 1.20 mg/dL  ? Total Bilirubin 0.8 0.2 - 1.2 mg/dL  ? Alkaline Phosphatase 48 39 - 117 U/L  ? AST 23 0 - 37 U/L  ? ALT 17 0 - 35 U/L  ? Total Protein 6.6 6.0 - 8.3 g/dL  ? Albumin 4.5 3.5 - 5.2 g/dL  ? GFR 83.13 >60.00 mL/min  ?  Comment: Calculated using the CKD-EPI Creatinine Equation (2021)  ? Calcium 9.8 8.4 - 10.5 mg/dL  ? ?Objective  ?Body mass index is 20.12 kg/m?. ?Wt Readings from Last 3 Encounters:  ?03/15/22 115 lb (52.2 kg)  ?03/09/21 117 lb 6.4 oz (53.3 kg)  ?03/03/20 115 lb 12.8 oz (52.5 kg)  ? ?Temp Readings from Last 3 Encounters:  ?03/15/22 98.7 ?F (37.1 ?C) (Oral)  ?03/09/21 97.9 ?F (36.6 ?C) (Oral)  ?03/03/20 97.7 ?F (36.5 ?C) (Temporal)  ? ?BP Readings from Last 3 Encounters:  ?03/15/22 100/70  ?03/09/21 98/70  ?03/03/20 100/70  ? ?Pulse Readings from Last 3 Encounters:  ?03/15/22 79  ?03/09/21 80  ?03/03/20 64  ? ? ?Physical Exam ?Vitals and nursing note reviewed.  ?Constitutional:   ?   Appearance: Normal appearance. She is well-developed and well-groomed.  ?HENT:  ?   Head: Normocephalic and atraumatic.  ?Eyes:  ?   Conjunctiva/sclera: Conjunctivae normal.  ?   Pupils: Pupils are equal, round, and reactive to light.  ?Cardiovascular:  ?   Rate and Rhythm: Normal rate and regular rhythm.  ?   Heart sounds: Normal heart sounds. No murmur heard. ?Pulmonary:  ?   Effort: Pulmonary effort is normal.  ?   Breath sounds: Normal breath sounds.  ?Abdominal:  ?   General: Abdomen is flat. Bowel sounds are normal.  ?   Tenderness: There is no abdominal tenderness.  ?Musculoskeletal:     ?   General: No tenderness.  ?Skin: ?   General: Skin is warm and dry.  ?Neurological:  ?   General: No focal deficit present.  ?   Mental Status: She is alert and oriented to person, place, and time. Mental status is at baseline.  ?   Cranial Nerves: Cranial nerves 2-12 are intact.  ?   Motor: Motor function is intact.  ?   Coordination: Coordination is intact.  ?   Gait: Gait is intact.  ?Psychiatric:     ?   Attention  and Perception: Attention and perception normal.     ?   Mood and Affect: Mood and affect normal.     ?   Speech: Speech normal.     ?   Behavior: Behavior normal. Behavior is cooperative.     ?  Thought Co

## 2022-05-13 ENCOUNTER — Ambulatory Visit
Admission: RE | Admit: 2022-05-13 | Discharge: 2022-05-13 | Disposition: A | Discharge status: Home / Self Care | Payer: BC Managed Care – PPO | Source: Ambulatory Visit | Attending: Internal Medicine | Admitting: Internal Medicine

## 2022-05-13 DIAGNOSIS — Z1231 Encounter for screening mammogram for malignant neoplasm of breast: Secondary | ICD-10-CM | POA: Insufficient documentation

## 2023-03-19 ENCOUNTER — Encounter: Payer: BC Managed Care – PPO | Admitting: Internal Medicine

## 2023-03-23 IMAGING — MG MM DIGITAL SCREENING BILAT W/ TOMO AND CAD
8 series · 9 of 24 positions shown · non-contrast
Comparison: Previous exam(s).

CLINICAL DATA: Screening.

EXAM:
DIGITAL SCREENING BILATERAL MAMMOGRAM WITH TOMOSYNTHESIS AND CAD
TECHNIQUE: Bilateral screening digital craniocaudal and mediolateral oblique
mammograms were obtained. Bilateral screening digital breast
tomosynthesis was performed. The images were evaluated with
computer-aided detection.

[R MLO synth-2D]
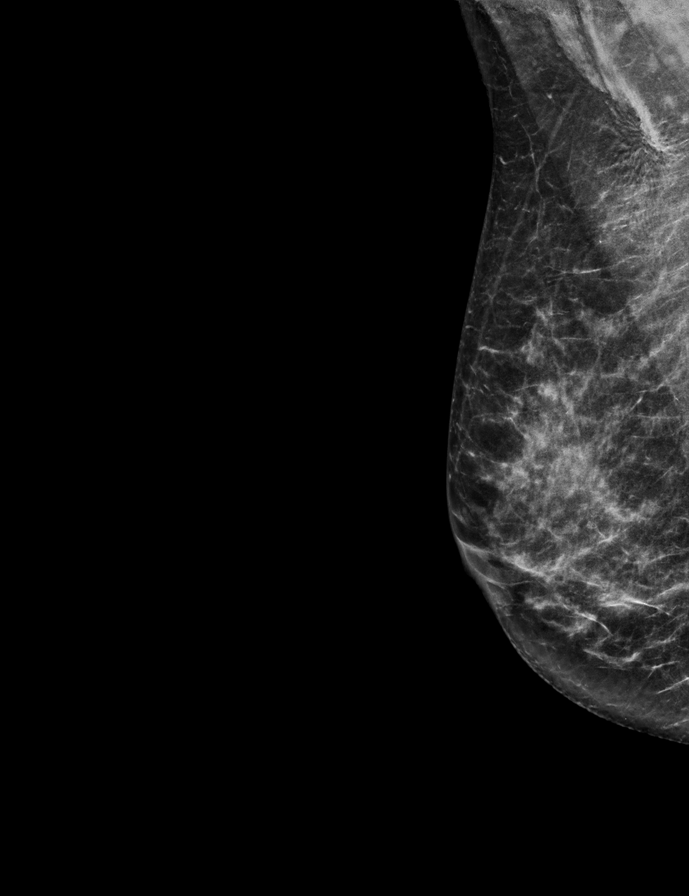

[L CC synth-2D]
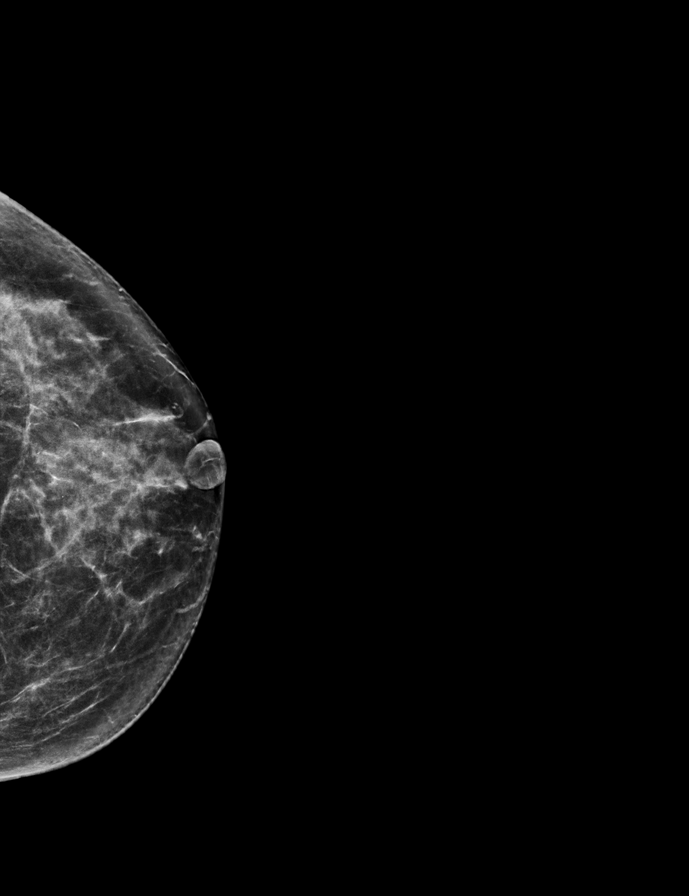

[R CC synth-2D]
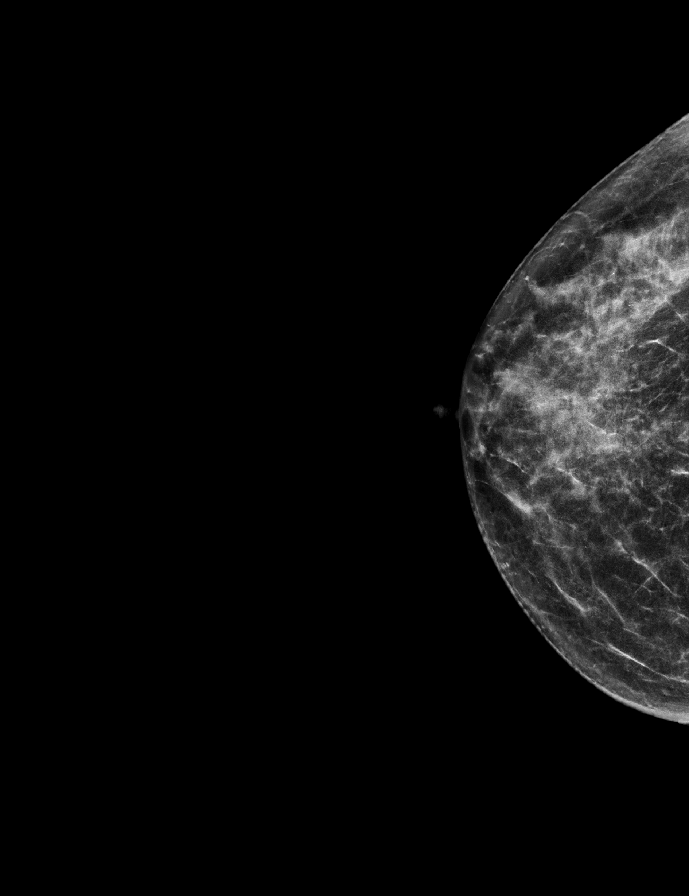

[L MLO synth-2D]
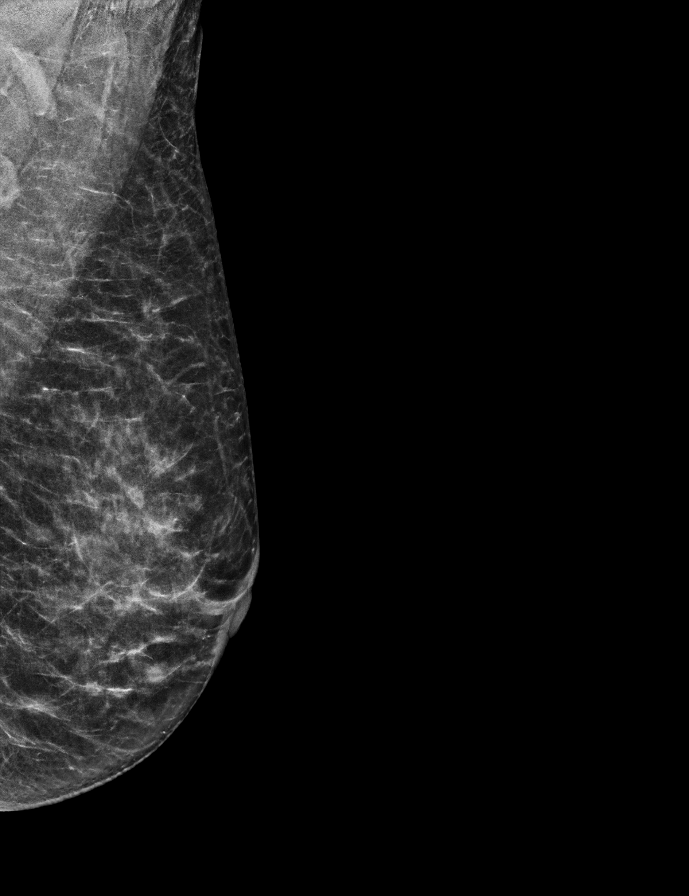

[R CC tomo · 2 of 56 frames shown]
[frame 19/56]
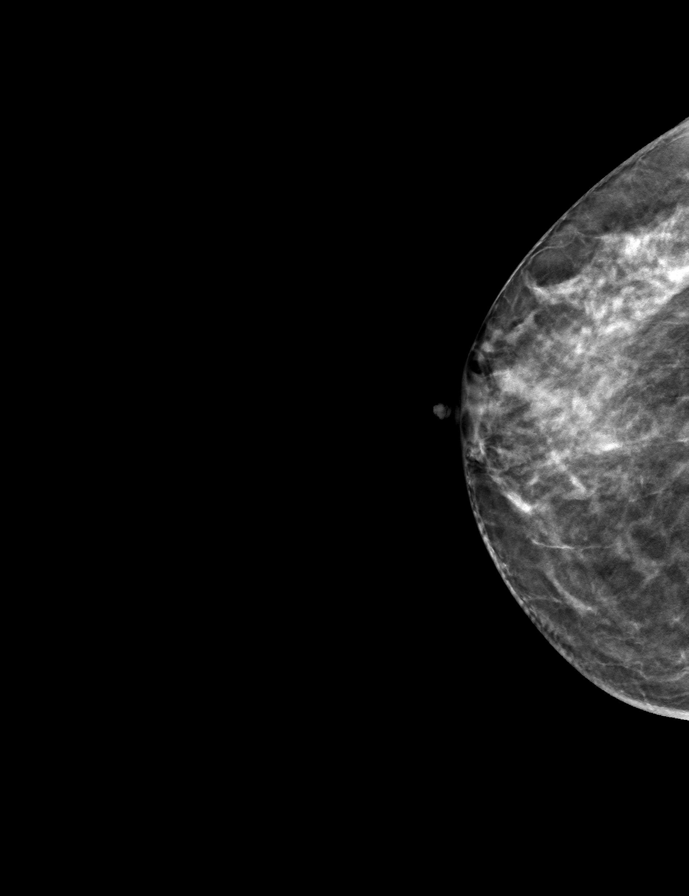
[frame 29/56]
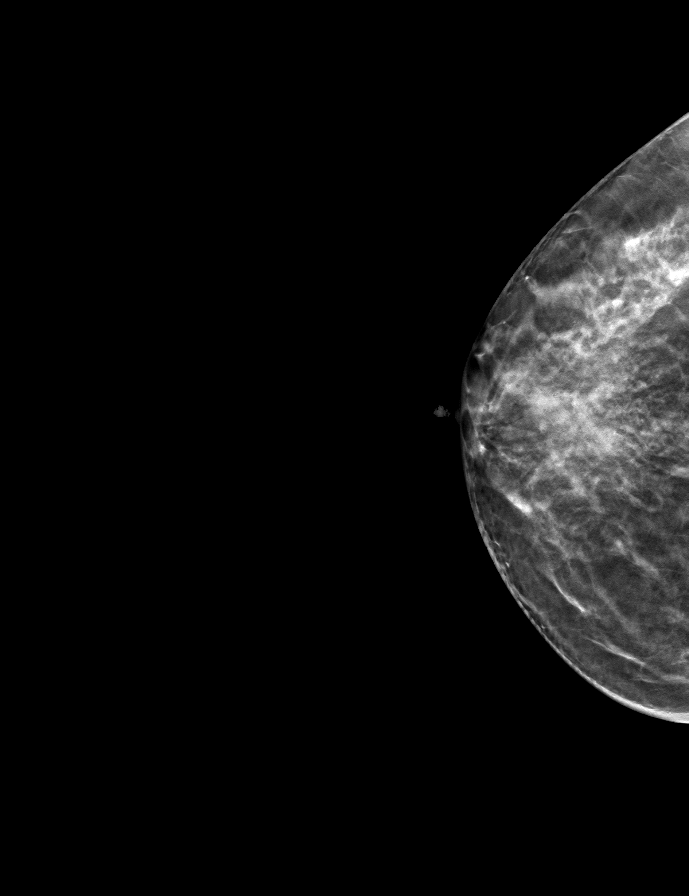

[R MLO tomo · tomo slice 29/57.0]
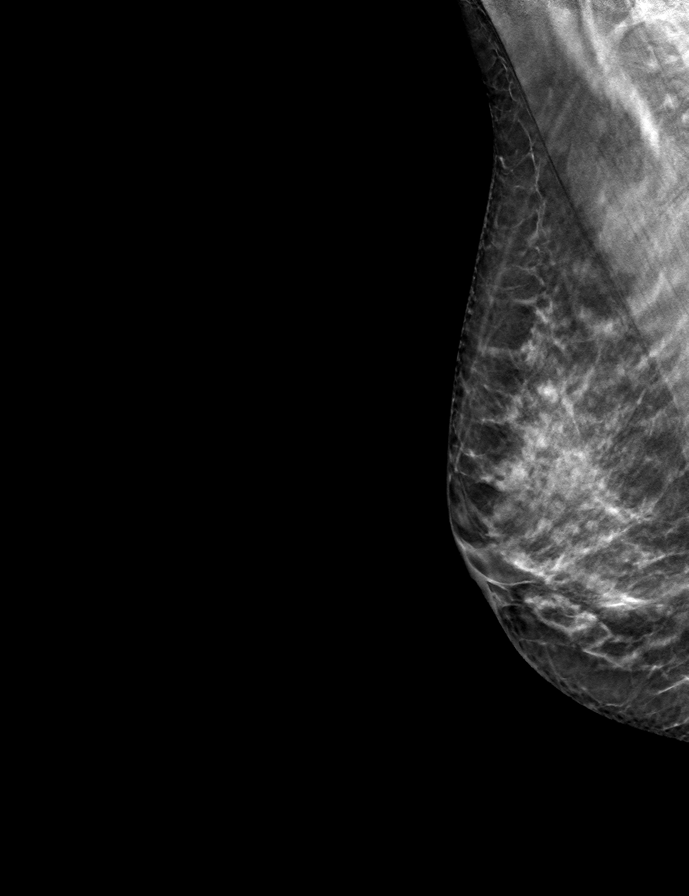

[L MLO tomo · tomo slice 29/56.0]
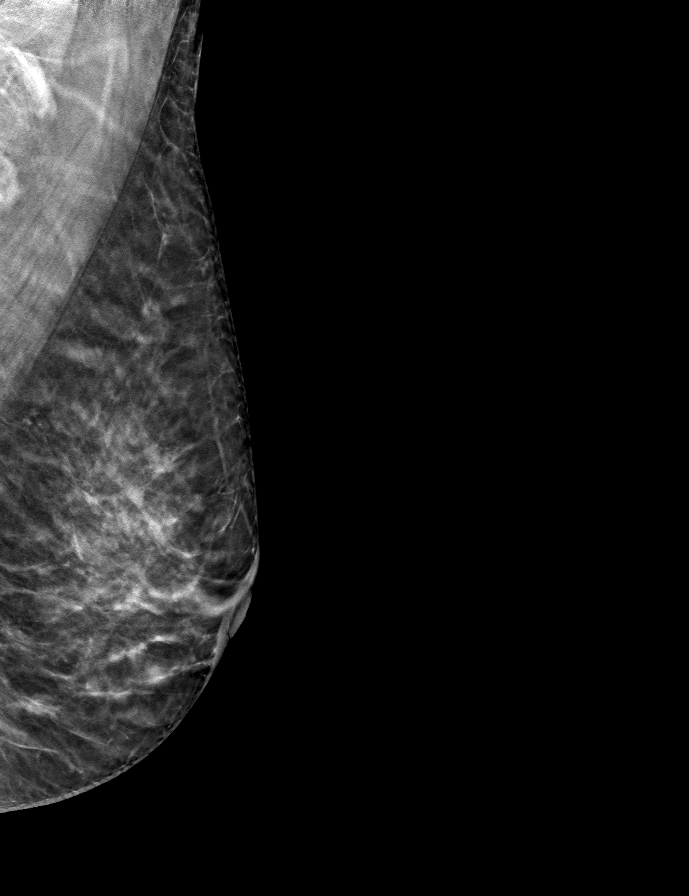

[L CC tomo · tomo slice 31/62.0]
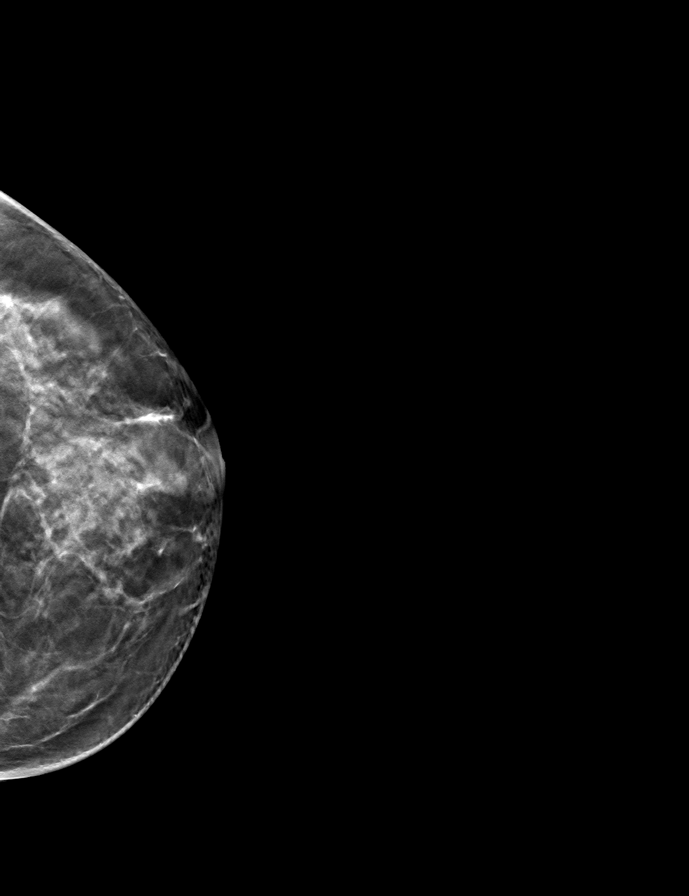

[9 of 24 positions shown; findings below may reference images not displayed]

ACR Breast Density Category c: The breast tissue is heterogeneously
dense, which may obscure small masses.
FINDINGS: There are no findings suspicious for malignancy.
IMPRESSION: No mammographic evidence of malignancy. A result letter of this
screening mammogram will be mailed directly to the patient.

RECOMMENDATION:
Screening mammogram in one year. (Code:Q3-W-BC3)

BI-RADS CATEGORY  1: Negative.

## 2023-04-17 ENCOUNTER — Ambulatory Visit: Payer: BC Managed Care – PPO | Admitting: Family Medicine

## 2023-04-17 ENCOUNTER — Encounter: Payer: Self-pay | Admitting: Family Medicine

## 2023-04-17 VITALS — BP 100/66 | HR 70 | Temp 98.4°F | Ht 63.0 in | Wt 120.8 lb

## 2023-04-17 DIAGNOSIS — Z114 Encounter for screening for human immunodeficiency virus [HIV]: Secondary | ICD-10-CM | POA: Diagnosis not present

## 2023-04-17 DIAGNOSIS — E785 Hyperlipidemia, unspecified: Secondary | ICD-10-CM

## 2023-04-17 DIAGNOSIS — E611 Iron deficiency: Secondary | ICD-10-CM

## 2023-04-17 DIAGNOSIS — E559 Vitamin D deficiency, unspecified: Secondary | ICD-10-CM

## 2023-04-17 DIAGNOSIS — Z1231 Encounter for screening mammogram for malignant neoplasm of breast: Secondary | ICD-10-CM

## 2023-04-17 DIAGNOSIS — E538 Deficiency of other specified B group vitamins: Secondary | ICD-10-CM | POA: Diagnosis not present

## 2023-04-17 DIAGNOSIS — Z1329 Encounter for screening for other suspected endocrine disorder: Secondary | ICD-10-CM

## 2023-04-17 MED ORDER — VITAMIN D (ERGOCALCIFEROL) 1.25 MG (50000 UNIT) PO CAPS: 50000.0000 [IU] | ORAL_CAPSULE | ORAL | 3 refills | Status: AC

## 2023-04-17 NOTE — Patient Instructions (Addendum)
It was a pleasure meeting you today. Thank you for allowing me to take part in your health care.  Our goals for today as we discussed include:  Schedule annual physical and Pap in 1 to 2 months Schedule lab appointment 1 week prior to office visit.  Fast for 10 hours prior to appointment.  Please have a copy of your shingles vaccine sent to clinic to update your chart.   If you have any questions or concerns, please do not hesitate to call the office at [phone].  I look forward to our next visit and until then take care and stay safe.  Regards,   Dana Allan, MD   [facility]

## 2023-04-17 NOTE — Progress Notes (Signed)
SUBJECTIVE:   Chief Complaint  Patient presents with   Transfer of Cre   HPI Patient presents to clinic to transfer care.  No acute concerns today.  Vitamin B12 deficiency Denies any history of malabsorption issues, gastric bypass.  Prescribed monthly vitamin B12 injection.  Agreeable to switch to sublingual.   Hyperlipidemia No indication for statin  Vitamin D deficiency Reports chronically low.  Has been taking vitamin D 1.25 mg monthly.  Tolerating well.  PERTINENT PMH / PSH: Hyperlipidemia   OBJECTIVE:  BP 100/66   Pulse 70   Temp 98.4 F (36.9 C) (Oral)   Ht 5\' 3"  (1.6 m)   Wt 120 lb 12.8 oz (54.8 kg)   LMP 01/01/2017   SpO2 98%   BMI 21.40 kg/m    Physical Exam Vitals reviewed.  Constitutional:      General: She is not in acute distress.    Appearance: Normal appearance. She is normal weight. She is not ill-appearing.  HENT:     Head: Normocephalic.     Nose: Nose normal.  Eyes:     Conjunctiva/sclera: Conjunctivae normal.  Neck:     Thyroid: No thyromegaly or thyroid tenderness.  Cardiovascular:     Rate and Rhythm: Normal rate and regular rhythm.     Heart sounds: Normal heart sounds.  Pulmonary:     Effort: Pulmonary effort is normal.     Breath sounds: Normal breath sounds.  Abdominal:     General: Abdomen is flat. Bowel sounds are normal.     Palpations: Abdomen is soft.  Musculoskeletal:        General: Normal range of motion.     Cervical back: Normal range of motion.  Neurological:     Mental Status: She is alert and oriented to person, place, and time. Mental status is at baseline.  Psychiatric:        Mood and Affect: Mood normal.        Behavior: Behavior normal.        Thought Content: Thought content normal.        Judgment: Judgment normal.     ASSESSMENT/PLAN:  Hyperlipidemia, unspecified hyperlipidemia type Assessment & Plan: Chronic.  Recent LDL 119.  No significant risk factors Check fasting lipid  Orders: -      Lipid panel; Future -     LDL cholesterol, direct; Future -     Comprehensive metabolic panel; Future -     Hemoglobin A1c; Future  B12 deficiency Assessment & Plan: Chronic.  Prescribed monthly injections Check B12 levels Check intrinsic factor Consider sublingual pending results   Orders: -     CBC with Differential/Platelet; Future -     Vitamin B12; Future -     Intrinsic Factor Antibodies; Future  Vitamin D deficiency Assessment & Plan: Chronic. Refill vitamin D 1.25 mg monthly Check vitamin D levels  Orders: -     Vitamin D (Ergocalciferol); Take 1 capsule (50,000 Units total) by mouth every 30 (thirty) days.  Dispense: 4 capsule; Refill: 3 -     VITAMIN D 25 Hydroxy (Vit-D Deficiency, Fractures); Future  Breast cancer screening by mammogram -     3D Screening Mammogram, Left and Right; Future  Encounter for screening for HIV -     HIV Antibody (routine testing w rflx); Future  HCM DEXA completed 05/22.  FRAX 5.9 and 0.8% Last Pap 06/21.  NILM, HPV negative.  Follows with OB/GYN at [facility] Referral sent for mammogram.  Patient to  call to schedule appointment. Colonoscopy up-to-date.  Due 09/2029 Hepatitis C HIV screening today  PDMP reviewed  Return for PCP.  Dana Allan, MD

## 2023-04-23 ENCOUNTER — Encounter: Payer: BC Managed Care – PPO | Admitting: Family Medicine

## 2023-04-29 ENCOUNTER — Encounter: Payer: Self-pay | Admitting: Family Medicine

## 2023-04-29 DIAGNOSIS — Z1231 Encounter for screening mammogram for malignant neoplasm of breast: Secondary | ICD-10-CM | POA: Insufficient documentation

## 2023-04-29 DIAGNOSIS — Z114 Encounter for screening for human immunodeficiency virus [HIV]: Secondary | ICD-10-CM | POA: Insufficient documentation

## 2023-04-29 DIAGNOSIS — Z1329 Encounter for screening for other suspected endocrine disorder: Secondary | ICD-10-CM | POA: Insufficient documentation

## 2023-04-29 NOTE — Assessment & Plan Note (Signed)
Chronic.  Recent LDL 119.  No significant risk factors Check fasting lipid

## 2023-04-29 NOTE — Assessment & Plan Note (Signed)
Chronic. Refill vitamin D 1.25 mg monthly Check vitamin D levels

## 2023-04-29 NOTE — Assessment & Plan Note (Addendum)
Chronic.  Prescribed monthly injections Check B12 levels Check intrinsic factor Consider sublingual pending results

## 2023-05-15 ENCOUNTER — Ambulatory Visit
Admission: RE | Admit: 2023-05-15 | Discharge: 2023-05-15 | Disposition: A | Discharge status: Home / Self Care | Payer: BC Managed Care – PPO | Source: Ambulatory Visit | Attending: Family Medicine | Admitting: Family Medicine

## 2023-05-15 DIAGNOSIS — Z1231 Encounter for screening mammogram for malignant neoplasm of breast: Secondary | ICD-10-CM

## 2023-05-20 ENCOUNTER — Encounter: Payer: Self-pay | Admitting: Family Medicine

## 2023-06-11 ENCOUNTER — Other Ambulatory Visit: Payer: BC Managed Care – PPO

## 2023-06-16 ENCOUNTER — Encounter: Payer: BC Managed Care – PPO | Admitting: Family Medicine

## 2023-07-03 ENCOUNTER — Encounter: Payer: BC Managed Care – PPO | Admitting: Family Medicine

## 2023-07-03 ENCOUNTER — Other Ambulatory Visit (INDEPENDENT_AMBULATORY_CARE_PROVIDER_SITE_OTHER): Payer: BC Managed Care – PPO

## 2023-07-03 DIAGNOSIS — E559 Vitamin D deficiency, unspecified: Secondary | ICD-10-CM

## 2023-07-03 DIAGNOSIS — Z114 Encounter for screening for human immunodeficiency virus [HIV]: Secondary | ICD-10-CM

## 2023-07-03 DIAGNOSIS — E538 Deficiency of other specified B group vitamins: Secondary | ICD-10-CM

## 2023-07-03 DIAGNOSIS — E785 Hyperlipidemia, unspecified: Secondary | ICD-10-CM

## 2023-07-03 LAB — CBC WITH DIFFERENTIAL/PLATELET
Basophils Absolute: 0 10*3/uL (ref 0.0–0.1)
Basophils Relative: 1 % (ref 0.0–3.0)
Eosinophils Absolute: 0.1 10*3/uL (ref 0.0–0.7)
Eosinophils Relative: 3.1 % (ref 0.0–5.0)
HCT: 39.8 % (ref 36.0–46.0)
Hemoglobin: 13.4 g/dL (ref 12.0–15.0)
Lymphocytes Relative: 36.2 % (ref 12.0–46.0)
Lymphs Abs: 1.5 10*3/uL (ref 0.7–4.0)
MCHC: 33.6 g/dL (ref 30.0–36.0)
MCV: 86 fl (ref 78.0–100.0)
Monocytes Absolute: 0.4 10*3/uL (ref 0.1–1.0)
Monocytes Relative: 9 % (ref 3.0–12.0)
Neutro Abs: 2.1 10*3/uL (ref 1.4–7.7)
Neutrophils Relative %: 50.7 % (ref 43.0–77.0)
Platelets: 207 10*3/uL (ref 150.0–400.0)
RBC: 4.63 Mil/uL (ref 3.87–5.11)
RDW: 12.5 % (ref 11.5–15.5)
WBC: 4.2 10*3/uL (ref 4.0–10.5)

## 2023-07-03 LAB — COMPREHENSIVE METABOLIC PANEL
ALT: 15 U/L (ref 0–35)
AST: 20 U/L (ref 0–37)
Albumin: 4.4 g/dL (ref 3.5–5.2)
Alkaline Phosphatase: 49 U/L (ref 39–117)
BUN: 15 mg/dL (ref 6–23)
CO2: 30 mEq/L (ref 19–32)
Calcium: 9.8 mg/dL (ref 8.4–10.5)
Chloride: 106 mEq/L (ref 96–112)
Creatinine, Ser: 0.92 mg/dL (ref 0.40–1.20)
GFR: 70.69 mL/min (ref >60.00)
Glucose, Bld: 93 mg/dL (ref 70–99)
Potassium: 4.2 mEq/L (ref 3.5–5.1)
Sodium: 141 mEq/L (ref 135–145)
Total Bilirubin: 0.5 mg/dL (ref 0.2–1.2)
Total Protein: 6.6 g/dL (ref 6.0–8.3)

## 2023-07-03 LAB — LDL CHOLESTEROL, DIRECT: Direct LDL: 150 mg/dL

## 2023-07-03 LAB — LIPID PANEL
Cholesterol: 238 mg/dL — ABNORMAL HIGH (ref 0–200)
HDL: 71 mg/dL (ref >39.00)
LDL Cholesterol: 156 mg/dL — ABNORMAL HIGH (ref 0–99)
NonHDL: 167.29
Total CHOL/HDL Ratio: 3
Triglycerides: 58 mg/dL (ref 0.0–149.0)
VLDL: 11.6 mg/dL (ref 0.0–40.0)

## 2023-07-03 LAB — VITAMIN B12: Vitamin B-12: 307 pg/mL (ref 211–911)

## 2023-07-03 LAB — VITAMIN D 25 HYDROXY (VIT D DEFICIENCY, FRACTURES): VITD: 36.58 ng/mL (ref 30.00–100.00)

## 2023-07-03 LAB — HEMOGLOBIN A1C: Hgb A1c MFr Bld: 5.2 % (ref 4.6–6.5)

## 2023-07-05 LAB — HIV ANTIBODY (ROUTINE TESTING W REFLEX): HIV 1&2 Ab, 4th Generation: NONREACTIVE

## 2023-07-05 LAB — INTRINSIC FACTOR ANTIBODIES: Intrinsic Factor: NEGATIVE

## 2023-07-10 ENCOUNTER — Encounter: Payer: BC Managed Care – PPO | Admitting: Family Medicine

## 2023-07-17 ENCOUNTER — Other Ambulatory Visit (HOSPITAL_COMMUNITY): Admission: RE | Admit: 2023-07-17 | Payer: BC Managed Care – PPO | Source: Ambulatory Visit

## 2023-07-17 ENCOUNTER — Encounter: Payer: Self-pay | Admitting: Family Medicine

## 2023-07-17 ENCOUNTER — Ambulatory Visit (INDEPENDENT_AMBULATORY_CARE_PROVIDER_SITE_OTHER): Payer: BC Managed Care – PPO | Admitting: Family Medicine

## 2023-07-17 VITALS — BP 116/72 | HR 70 | Temp 98.1°F | Ht 63.0 in | Wt 119.4 lb

## 2023-07-17 DIAGNOSIS — Z124 Encounter for screening for malignant neoplasm of cervix: Secondary | ICD-10-CM

## 2023-07-17 DIAGNOSIS — E538 Deficiency of other specified B group vitamins: Secondary | ICD-10-CM

## 2023-07-17 DIAGNOSIS — Z0000 Encounter for general adult medical examination without abnormal findings: Secondary | ICD-10-CM

## 2023-07-17 NOTE — Patient Instructions (Addendum)
It was a pleasure meeting you today. Thank you for allowing me to take part in your health care.  Our goals for today as we discussed include:  Will Mychart you with PAP results  Recheck cholesterol in 6 months Schedule fasting lab appointment  Start Fish oil tablets daily Increase soluble fiber daily   If you have any questions or concerns, please do not hesitate to call the office at [phone].  I look forward to our next visit and until then take care and stay safe.  Regards,   Dana Allan, MD   [facility]

## 2023-07-17 NOTE — Progress Notes (Addendum)
SUBJECTIVE:   Chief Complaint  Patient presents with   Annual Exam   HPI Patient presents to clinic for annual exam.  No acute concerns today.  Vitamin B12 deficiency Intrinsic factor negative. Denies any history of malabsorption issues, gastric bypass.  Prescribed monthly vitamin B12 injection.  Agreeable to switch to sublingual.  Hyperlipidemia No indication for statin The 10-year ASCVD risk score (Arnett DK, et al., 2019) is: 1.4%   Vitamin D deficiency Asymptomatic. Taking monthly supplements.    Review of Systems - General ROS: negative   PERTINENT PMH / PSH: Hyperlipidemia Vitamin D Deficiency Vitamin B 12 deficiency Osteopenia   OBJECTIVE:  BP 116/72 (BP Location: Right Arm, Patient Position: Sitting, Cuff Size: Large)   Pulse 70   Temp 98.1 F (36.7 C) (Oral)   Ht 5\' 3"  (1.6 m)   Wt 119 lb 6.4 oz (54.2 kg)   LMP 01/01/2017   SpO2 98%   BMI 21.15 kg/m    Physical Exam Vitals reviewed. Exam conducted with a chaperone present.  Constitutional:      General: She is not in acute distress.    Appearance: Normal appearance. She is normal weight. She is not ill-appearing.  HENT:     Head: Normocephalic.     Nose: Nose normal.  Eyes:     Conjunctiva/sclera: Conjunctivae normal.  Neck:     Thyroid: No thyromegaly or thyroid tenderness.  Cardiovascular:     Rate and Rhythm: Normal rate and regular rhythm.     Heart sounds: Normal heart sounds.  Pulmonary:     Effort: Pulmonary effort is normal.     Breath sounds: Normal breath sounds.  Abdominal:     General: Abdomen is flat. Bowel sounds are normal.     Palpations: Abdomen is soft.  Genitourinary:    General: Normal vulva.     Exam position: Lithotomy position.     Labia:        Right: No rash or lesion.        Left: No rash or lesion.      Vagina: Normal.     Cervix: Normal. No cervical motion tenderness, discharge, friability, lesion or cervical bleeding.     Uterus: Normal. Not tender.       Adnexa: Right adnexa normal and left adnexa normal.  Musculoskeletal:        General: Normal range of motion.     Cervical back: Normal range of motion.  Neurological:     Mental Status: She is alert and oriented to person, place, and time. Mental status is at baseline.  Psychiatric:        Mood and Affect: Mood normal.        Behavior: Behavior normal.        Thought Content: Thought content normal.        Judgment: Judgment normal.       07/17/2023   11:37 AM 04/17/2023    2:57 PM 03/15/2022    9:02 AM 03/09/2021   10:54 AM 03/03/2020    3:05 PM  Depression screen PHQ 2/9  Decreased Interest 0 0 0 0 0  Down, Depressed, Hopeless 0 0 0 0 0  PHQ - 2 Score 0 0 0 0 0  Altered sleeping 0   0   Tired, decreased energy 0   0   Change in appetite 0   0   Feeling bad or failure about yourself  0   0   Trouble concentrating 0  0   Moving slowly or fidgety/restless 0   0   Suicidal thoughts 0   0   PHQ-9 Score 0   0   Difficult doing work/chores Not difficult at all   Not difficult at all        07/17/2023   11:37 AM 03/09/2021   10:54 AM 03/03/2020    3:05 PM  GAD 7 : Generalized Anxiety Score  Nervous, Anxious, on Edge 0 0 0  Control/stop worrying 0 0 0  Worry too much - different things 0 0 0  Trouble relaxing 0 0 0  Restless 0 0 0  Easily annoyed or irritable 0 0 0  Afraid - awful might happen 0 0 0  Total GAD 7 Score 0 0 0  Anxiety Difficulty Not difficult at all Not difficult at all Not difficult at all      ASSESSMENT/PLAN:  Annual physical exam Assessment & Plan: HCM DEXA completed 05/22.  FRAX 5.9 and 0.8% Recommend Calcium 1200 mg daily and Continue Vitamin D monthly as previously prescribed PAP completed today Mammogram up to date Recommend regular self breast exams Colonoscopy up-to-date.  Due 09/2029 Hepatitis C/ HIV screening completed Tetanus up to date Shingles completed PHQ9/GAD screening completed   Cervical cancer screening -     Cytology -  PAP  B12 deficiency -     Vitamin B-12; Place 1 tablet (1,000 mcg total) under the tongue daily.  Dispense: 90 tablet; Refill: 3    PDMP reviewed  Return if symptoms worsen or fail to improve, for PCP.  Dana Allan, MD

## 2023-07-21 ENCOUNTER — Encounter: Payer: Self-pay | Admitting: Family Medicine

## 2023-08-02 ENCOUNTER — Encounter: Payer: Self-pay | Admitting: Family Medicine

## 2023-08-02 MED ORDER — VITAMIN B-12 1000 MCG SL SUBL
1000.0000 ug | SUBLINGUAL_TABLET | Freq: Every day | SUBLINGUAL | 3 refills | Status: AC
Start: 2023-08-02 — End: ?

## 2023-08-02 NOTE — Assessment & Plan Note (Addendum)
HCM DEXA completed 05/22.  FRAX 5.9 and 0.8% Recommend Calcium 1200 mg daily and Continue Vitamin D monthly as previously prescribed PAP completed today Mammogram up to date Recommend regular self breast exams Colonoscopy up-to-date.  Due 09/2029 Hepatitis C/ HIV screening completed Tetanus up to date Shingles completed PHQ9/GAD screening completed

## 2024-01-07 ENCOUNTER — Other Ambulatory Visit: Payer: Self-pay | Admitting: Family Medicine

## 2024-01-07 ENCOUNTER — Telehealth: Payer: Self-pay | Admitting: Family Medicine

## 2024-01-07 DIAGNOSIS — E785 Hyperlipidemia, unspecified: Secondary | ICD-10-CM

## 2024-01-07 NOTE — Telephone Encounter (Signed)
 Patient need lab orders.

## 2024-01-19 ENCOUNTER — Other Ambulatory Visit (INDEPENDENT_AMBULATORY_CARE_PROVIDER_SITE_OTHER): Payer: 59

## 2024-01-19 DIAGNOSIS — E785 Hyperlipidemia, unspecified: Secondary | ICD-10-CM

## 2024-01-19 LAB — LIPID PANEL
Cholesterol: 226 mg/dL — ABNORMAL HIGH (ref 0–200)
HDL: 69.6 mg/dL (ref >39.00)
LDL Cholesterol: 143 mg/dL — ABNORMAL HIGH (ref 0–99)
NonHDL: 156.23
Total CHOL/HDL Ratio: 3
Triglycerides: 67 mg/dL (ref 0.0–149.0)
VLDL: 13.4 mg/dL (ref 0.0–40.0)

## 2024-01-29 ENCOUNTER — Telehealth: Payer: Self-pay

## 2024-01-29 NOTE — Telephone Encounter (Signed)
-----   Message from Glenys Ferrari sent at 01/25/2024  4:11 PM EST ----- Total cholesterol was high at 236 (nml is <200) and LDL (bad) cholesterol was 843 which is also elevated (nml is <99).   You can decrease these numbers by: -limiting your intake of processed foods and foods high in saturated fats.  -increasing your physical activity -increasing your intake of vegetables, if not allergic, healthy fish (bluefin tuna, wild salmon, and sardines), whole grains and fiber rich foods .   -You can also use extra virgin olive oil instead of butter. -If you smoke, quitting can decrease LDL cholesterol and raise HDL cholesterol.  Consider low dose statin medication given continuing rise in cholesterol.  If any questions can schedule appointment to discuss

## 2024-01-29 NOTE — Telephone Encounter (Signed)
 Left message to return call to our office.  Okay to read message to pt. Please document when  message has been read to pt.

## 2024-01-30 ENCOUNTER — Ambulatory Visit: Payer: Self-pay | Admitting: Family Medicine

## 2024-01-30 NOTE — Telephone Encounter (Signed)
 Called pt and she stated that she is not going to ER I did urge her to go to UC and get checked out.

## 2024-01-30 NOTE — Telephone Encounter (Signed)
  Chief Complaint: left sided chest pain Symptoms: intermittent left sided chest pain radiates to under left arm and back Frequency: occurs with taking a deep breath and relieved  by exhaling Pertinent Negatives: Patient denies difficulty breathing/SOB, dizziness, nausea, vomiting, cough, fever, cold symptoms, overuse/exercise or known injury. Disposition: [x] ED /[] Urgent Care (no appt availability in office) / [] Appointment(In office/virtual)/ []  [facility]/ [] Home Care/ [] Refused Recommended Disposition /[]  Mobile Bus/ []  Follow-up with PCP Additional Notes: Patient c/o left sided chest pain under left breast x 1 week. She states she thinks she pulled a muscle. Chest pain occurs with inhalation, worse when taking a deep breath/sneezing/coughing/at night. Patient agreeable to ED disposition.   Copied from CRM [id number]. Topic: Clinical - Red Word Triage >> Jan 30, 2024  1:52 PM Corin V wrote: [facility] that prompted transfer to Nurse Triage: Patient is having a pain in her side and trouble breathing. It is a sharp pain when taking a deep breath or coughing. Reason for Disposition  [1] Chest pain (or angina) comes and goes AND [2] is happening more often (increasing in frequency) or getting worse (increasing in severity)  (Exception: Chest pains that last only a few seconds.)  Answer Assessment - Initial Assessment Questions 1. LOCATION: Where does it hurt?       Left side under left breast.  2. RADIATION: Does the pain go anywhere else? (e.g., into neck, jaw, arms, back)     Under left arm and back.  3. ONSET: When did the chest pain begin? (Minutes, hours or days)      X 1 week.  4. PATTERN: Does the pain come and go, or has it been constant since it started?  Does it get worse with exertion?      Constant every time I breath in.  Worse with deep breath, sneezing, coughing and at night when lying down.  5. DURATION: How long does it last (e.g.,  seconds, minutes, hours)     Constant all the time.  6. SEVERITY: How bad is the pain?  (e.g., Scale 1-10; mild, moderate, or severe)    - MILD (1-3): doesn't interfere with normal activities     - MODERATE (4-7): interferes with normal activities or awakens from sleep    - SEVERE (8-10): excruciating pain, unable to do any normal activities       5/10.  7. CARDIAC RISK FACTORS: Do you have any history of heart problems or risk factors for heart disease? (e.g., angina, prior heart attack; diabetes, high blood pressure, high cholesterol, smoker, or strong family history of heart disease)     High cholesterol, mitral valve prolapse.  8. PULMONARY RISK FACTORS: Do you have any history of lung disease?  (e.g., blood clots in lung, asthma, emphysema, birth control pills)     Denies.  9. CAUSE: What do you think is causing the chest pain?     Patient states she is active and works out, states she has not lifted weights since last Tuesday. She states she also was attending yoga class. She states she can not think of any injury or overuse.  10. OTHER SYMPTOMS: Do you have any other symptoms? (e.g., dizziness, nausea, vomiting, sweating, fever, difficulty breathing, cough)       Denies.  Protocols used: Chest Pain-A-AH

## 2024-02-01 ENCOUNTER — Emergency Department
Admission: EM | Admit: 2024-02-01 | Discharge: 2024-02-01 | Disposition: A | Discharge status: Home / Self Care | Payer: 59 | Attending: Emergency Medicine | Admitting: Emergency Medicine

## 2024-02-01 ENCOUNTER — Emergency Department: Payer: 59

## 2024-02-01 ENCOUNTER — Inpatient Hospital Stay
Admission: RE | Admit: 2024-02-01 | Discharge: 2024-02-01 | Disposition: A | Discharge status: Home / Self Care | Payer: 59 | Source: Ambulatory Visit

## 2024-02-01 DIAGNOSIS — R079 Chest pain, unspecified: Secondary | ICD-10-CM

## 2024-02-01 DIAGNOSIS — R0789 Other chest pain: Secondary | ICD-10-CM | POA: Diagnosis present

## 2024-02-01 HISTORY — DX: Pure hypercholesterolemia, unspecified: E78.00

## 2024-02-01 LAB — COMPREHENSIVE METABOLIC PANEL
ALT: 15 U/L (ref 0–44)
AST: 22 U/L (ref 15–41)
Albumin: 4.1 g/dL (ref 3.5–5.0)
Alkaline Phosphatase: 49 U/L (ref 38–126)
Anion gap: 12 (ref 5–15)
BUN: 18 mg/dL (ref 6–20)
CO2: 23 mmol/L (ref 22–32)
Calcium: 9.6 mg/dL (ref 8.9–10.3)
Chloride: 102 mmol/L (ref 98–111)
Creatinine, Ser: 0.82 mg/dL (ref 0.44–1.00)
GFR, Estimated: >60 mL/min (ref >60)
Glucose, Bld: 141 mg/dL — ABNORMAL HIGH (ref 70–99)
Potassium: 3.5 mmol/L (ref 3.5–5.1)
Sodium: 137 mmol/L (ref 135–145)
Total Bilirubin: 0.6 mg/dL (ref 0.0–1.2)
Total Protein: 6.9 g/dL (ref 6.5–8.1)

## 2024-02-01 LAB — TROPONIN I (HIGH SENSITIVITY): Troponin I (High Sensitivity): <2 ng/L (ref <18)

## 2024-02-01 LAB — CBC
HCT: 39.3 % (ref 36.0–46.0)
Hemoglobin: 13.5 g/dL (ref 12.0–15.0)
MCH: 28.8 pg (ref 26.0–34.0)
MCHC: 34.4 g/dL (ref 30.0–36.0)
MCV: 83.8 fL (ref 80.0–100.0)
Platelets: 222 10*3/uL (ref 150–400)
RBC: 4.69 MIL/uL (ref 3.87–5.11)
RDW: 11.9 % (ref 11.5–15.5)
WBC: 5.5 10*3/uL (ref 4.0–10.5)
nRBC: 0 % (ref 0.0–0.2)

## 2024-02-01 MED ORDER — IOHEXOL 350 MG/ML SOLN
75.0000 mL | Freq: Once | INTRAVENOUS | Status: AC | PRN
  Administered 2024-02-01: 75 mL via INTRAVENOUS

## 2024-02-01 MED ORDER — HYDROCODONE-ACETAMINOPHEN 5-325 MG PO TABS: 1.0000 | ORAL_TABLET | ORAL | 0 refills | Status: AC | PRN

## 2024-02-01 MED ORDER — HYDROCODONE-ACETAMINOPHEN 5-325 MG PO TABS
1.0000 | ORAL_TABLET | Freq: Once | ORAL | Status: DC
  Filled 2024-02-01: qty 1

## 2024-02-01 MED ORDER — HYDROCODONE-ACETAMINOPHEN 5-325 MG PO TABS: 1.0000 | ORAL_TABLET | ORAL | 0 refills | Status: DC | PRN

## 2024-02-01 NOTE — ED Provider Notes (Signed)
 [facility] Provider Note    Event Date/Time   First MD Initiated Contact with Patient 02/01/24 [phone]     (approximate)  History   Chief Complaint: Chest Pain  HPI  Vicki Hart is a 53 y.o. female with no significant past medical history on no medications who presents to the emergency department for left-sided chest pain.  According to the patient for the past 3 days or so she has been experiencing pain to her left chest worse with inspiration.  Patient states a very minimal discomfort at baseline however if she tries to inspire or yell or talk she feels a sharp pain to her left chest.  Denies any fever cough.  No leg pain swelling no history of DVT no estrogen or hormonal therapy.  Physical Exam   Triage Vital Signs: ED Triage Vitals  Encounter Vitals Group     BP      Systolic BP Percentile      Diastolic BP Percentile      Pulse      Resp      Temp      Temp src      SpO2      Weight      Height      Head Circumference      Peak Flow      Pain Score      Pain Loc      Pain Education      Exclude from Growth Chart     Most recent vital signs: There were no vitals filed for this visit.  General: Awake, no distress.  CV:  Good peripheral perfusion.  Regular rate and rhythm  Resp:  Normal effort.  Equal breath sounds bilaterally.  No chest wall tenderness to palpation or compression Abd:  No distention.  Soft, nontender.  No rebound or guarding.  ED Results / Procedures / Treatments   EKG  EKG viewed and interpreted by myself shows a sinus rhythm 83 bpm with a narrow QRS, normal axis, normal intervals, no concerning ST changes.  RADIOLOGY  I have reviewed and the CTA images no obvious PE seen on my evaluation. Radiology is read the CT scan as negative for acute finding.   MEDICATIONS ORDERED IN ED: Medications - No data to display   IMPRESSION / MDM / ASSESSMENT AND PLAN / ED COURSE  I reviewed the triage vital signs and the  nursing notes.  Patient's presentation is most consistent with acute presentation with potential threat to life or bodily function.  Patient presents to the emergency department for left-sided chest pain, sharp in nature that occurs mostly with inspiration talking or yelling.  Patient overall appears well, chest pain is not reproducible with palpation or compression.  No leg pain or swelling.  No history of DVT no estrogen or hormonal therapy.  Overall patient appears well however given the patient's description of this pleuritic chest pain that has been ongoing for multiple days discussed with the patient she is agreeable to proceed with lab work and a CTA of the chest to rule out pulmonary embolism mass or tumor.  Will also obtain labs including troponin and EKG.  Patient agreeable to plan.  Patient's workup in the emergency department is reassuring, CBC is normal chemistry is normal and troponin reassuringly negative.  EKG shows no concerning findings in the CT scan of the chest is negative for any acute abnormality.  Highly suspect more muscular strain/discomfort given the patient's pain  with inspiration somewhat with movement or with yelling.  We will place the patient on a short course of pain medication have the patient follow-up with her doctor.  Patient agreeable to plan of care.  FINAL CLINICAL IMPRESSION(S) / ED DIAGNOSES   Left chest pain Chest wall pain  Note:  This document was prepared using Dragon voice recognition software and may include unintentional dictation errors.   Dorothyann Drivers, MD 02/01/24 [phone]

## 2024-02-01 NOTE — ED Triage Notes (Signed)
 Pt c/o L chest/rib cage pain under L breast increasing w/ movement and deep breathing.  Pain score 6/10.  Denies associated cardiac symptoms.      Pt easily speaking full sentences.

## 2024-02-01 NOTE — ED Notes (Signed)
 Pt taken to CT.

## 2024-02-01 NOTE — ED Notes (Signed)
 Pt returned from CT

## 2024-03-31 ENCOUNTER — Other Ambulatory Visit: Payer: Self-pay | Admitting: Family Medicine

## 2024-03-31 DIAGNOSIS — Z1231 Encounter for screening mammogram for malignant neoplasm of breast: Secondary | ICD-10-CM

## 2024-05-19 ENCOUNTER — Ambulatory Visit
Admission: RE | Admit: 2024-05-19 | Discharge: 2024-05-19 | Disposition: A | Discharge status: Home / Self Care | Source: Ambulatory Visit | Attending: Family Medicine | Admitting: Family Medicine

## 2024-05-19 DIAGNOSIS — Z1231 Encounter for screening mammogram for malignant neoplasm of breast: Secondary | ICD-10-CM | POA: Insufficient documentation

## 2025-02-09 ENCOUNTER — Encounter: Admitting: Nurse Practitioner
